# Patient Record
Sex: Female | Born: 1935 | Race: Black or African American | Hispanic: No | Marital: Single | Smoking: Never smoker
Health system: Southern US, Community
[De-identification: ages and names within clinical notes are randomized; demographics above are authoritative.]

## PROBLEM LIST (undated history)

## (undated) DIAGNOSIS — I10 Essential (primary) hypertension: Secondary | ICD-10-CM

## (undated) HISTORY — PX: ABDOMINAL HYSTERECTOMY: SHX81

---

## 1997-08-31 ENCOUNTER — Observation Stay (HOSPITAL_COMMUNITY): Admission: EM | Admit: 1997-08-31 | Discharge: 1997-09-01 | Payer: Self-pay | Admitting: Emergency Medicine

## 2000-09-20 ENCOUNTER — Emergency Department (HOSPITAL_COMMUNITY): Admission: EM | Admit: 2000-09-20 | Discharge: 2000-09-20 | Payer: Self-pay | Admitting: Emergency Medicine

## 2000-09-20 ENCOUNTER — Encounter: Payer: Self-pay | Admitting: Emergency Medicine

## 2000-12-31 ENCOUNTER — Other Ambulatory Visit: Admission: RE | Admit: 2000-12-31 | Discharge: 2000-12-31 | Payer: Self-pay | Admitting: Family Medicine

## 2001-02-04 ENCOUNTER — Emergency Department (HOSPITAL_COMMUNITY): Admission: EM | Admit: 2001-02-04 | Discharge: 2001-02-04 | Payer: Self-pay | Admitting: Emergency Medicine

## 2001-02-04 ENCOUNTER — Encounter: Payer: Self-pay | Admitting: Emergency Medicine

## 2003-04-11 ENCOUNTER — Emergency Department (HOSPITAL_COMMUNITY): Admission: EM | Admit: 2003-04-11 | Discharge: 2003-04-12 | Payer: Self-pay | Admitting: Emergency Medicine

## 2003-04-28 ENCOUNTER — Ambulatory Visit (HOSPITAL_COMMUNITY): Admission: RE | Admit: 2003-04-28 | Discharge: 2003-04-28 | Payer: Self-pay | Admitting: Family Medicine

## 2003-06-19 ENCOUNTER — Ambulatory Visit (HOSPITAL_COMMUNITY): Admission: RE | Admit: 2003-06-19 | Discharge: 2003-06-19 | Payer: Self-pay | Admitting: Family Medicine

## 2003-10-03 ENCOUNTER — Emergency Department (HOSPITAL_COMMUNITY): Admission: EM | Admit: 2003-10-03 | Discharge: 2003-10-03 | Payer: Self-pay | Admitting: Family Medicine

## 2005-03-15 ENCOUNTER — Emergency Department (HOSPITAL_COMMUNITY): Admission: EM | Admit: 2005-03-15 | Discharge: 2005-03-15 | Payer: Self-pay | Admitting: Emergency Medicine

## 2005-03-19 ENCOUNTER — Emergency Department (HOSPITAL_COMMUNITY): Admission: EM | Admit: 2005-03-19 | Discharge: 2005-03-19 | Payer: Self-pay | Admitting: Emergency Medicine

## 2006-11-27 ENCOUNTER — Encounter (INDEPENDENT_AMBULATORY_CARE_PROVIDER_SITE_OTHER): Payer: Self-pay | Admitting: Family Medicine

## 2007-12-17 ENCOUNTER — Encounter: Admission: RE | Admit: 2007-12-17 | Discharge: 2007-12-17 | Payer: Self-pay | Admitting: Family Medicine

## 2008-03-02 ENCOUNTER — Other Ambulatory Visit: Admission: RE | Admit: 2008-03-02 | Discharge: 2008-03-02 | Payer: Self-pay | Admitting: Family Medicine

## 2008-03-25 ENCOUNTER — Other Ambulatory Visit: Admission: RE | Admit: 2008-03-25 | Discharge: 2008-03-25 | Payer: Self-pay | Admitting: Family Medicine

## 2008-05-16 ENCOUNTER — Emergency Department (HOSPITAL_COMMUNITY): Admission: EM | Admit: 2008-05-16 | Discharge: 2008-05-16 | Payer: Self-pay | Admitting: Family Medicine

## 2008-06-09 ENCOUNTER — Encounter: Admission: RE | Admit: 2008-06-09 | Discharge: 2008-06-09 | Payer: Self-pay | Admitting: Family Medicine

## 2008-09-15 ENCOUNTER — Emergency Department (HOSPITAL_COMMUNITY): Admission: EM | Admit: 2008-09-15 | Discharge: 2008-09-15 | Payer: Self-pay | Admitting: Family Medicine

## 2008-09-17 ENCOUNTER — Encounter: Admission: RE | Admit: 2008-09-17 | Discharge: 2008-09-17 | Payer: Self-pay | Admitting: Family Medicine

## 2009-04-17 ENCOUNTER — Emergency Department (HOSPITAL_COMMUNITY): Admission: EM | Admit: 2009-04-17 | Discharge: 2009-04-17 | Payer: Self-pay | Admitting: Family Medicine

## 2009-04-27 ENCOUNTER — Emergency Department (HOSPITAL_COMMUNITY)
Admission: EM | Admit: 2009-04-27 | Discharge: 2009-04-27 | Payer: Self-pay | Source: Home / Self Care | Admitting: Family Medicine

## 2009-05-27 ENCOUNTER — Other Ambulatory Visit: Admission: RE | Admit: 2009-05-27 | Discharge: 2009-05-27 | Payer: Self-pay | Admitting: Family Medicine

## 2009-09-28 ENCOUNTER — Encounter: Admission: RE | Admit: 2009-09-28 | Discharge: 2009-09-28 | Payer: Self-pay | Admitting: Family Medicine

## 2009-09-29 ENCOUNTER — Encounter: Admission: RE | Admit: 2009-09-29 | Discharge: 2009-09-29 | Payer: Self-pay | Admitting: Family Medicine

## 2010-01-11 ENCOUNTER — Emergency Department (HOSPITAL_COMMUNITY): Admission: EM | Admit: 2010-01-11 | Discharge: 2010-01-11 | Payer: Self-pay | Admitting: Family Medicine

## 2010-06-05 LAB — POCT I-STAT, CHEM 8
BUN: 25 mg/dL — ABNORMAL HIGH (ref 6–23)
Calcium, Ion: 1.27 mmol/L (ref 1.12–1.32)
Creatinine, Ser: 1.1 mg/dL (ref 0.4–1.2)
TCO2: 29 mmol/L (ref 0–100)

## 2014-06-04 ENCOUNTER — Ambulatory Visit (HOSPITAL_COMMUNITY): Admission: RE | Admit: 2014-06-04 | Payer: Self-pay | Source: Ambulatory Visit | Admitting: Cardiology

## 2014-06-04 ENCOUNTER — Encounter (HOSPITAL_COMMUNITY): Admission: RE | Payer: Self-pay | Source: Ambulatory Visit

## 2014-06-04 SURGERY — CARDIOVERSION
Anesthesia: Monitor Anesthesia Care

## 2015-07-21 ENCOUNTER — Emergency Department (HOSPITAL_COMMUNITY)
Admission: EM | Admit: 2015-07-21 | Discharge: 2015-07-22 | Disposition: A | Discharge status: Home / Self Care | Payer: Medicare PPO | Attending: Emergency Medicine | Admitting: Emergency Medicine

## 2015-07-21 ENCOUNTER — Ambulatory Visit (HOSPITAL_COMMUNITY)
Admission: EM | Admit: 2015-07-21 | Discharge: 2015-07-21 | Disposition: A | Discharge status: Other Acute Inpatient Hospital | Payer: Medicare PPO | Source: Home / Self Care | Attending: Family Medicine | Admitting: Family Medicine

## 2015-07-21 ENCOUNTER — Encounter (HOSPITAL_COMMUNITY): Payer: Self-pay | Admitting: *Deleted

## 2015-07-21 ENCOUNTER — Encounter (HOSPITAL_COMMUNITY): Payer: Self-pay | Admitting: Emergency Medicine

## 2015-07-21 ENCOUNTER — Other Ambulatory Visit: Payer: Self-pay

## 2015-07-21 ENCOUNTER — Emergency Department (HOSPITAL_COMMUNITY): Payer: Medicare PPO

## 2015-07-21 DIAGNOSIS — Z79899 Other long term (current) drug therapy: Secondary | ICD-10-CM | POA: Insufficient documentation

## 2015-07-21 DIAGNOSIS — I482 Chronic atrial fibrillation, unspecified: Secondary | ICD-10-CM

## 2015-07-21 DIAGNOSIS — I10 Essential (primary) hypertension: Secondary | ICD-10-CM | POA: Insufficient documentation

## 2015-07-21 DIAGNOSIS — R42 Dizziness and giddiness: Secondary | ICD-10-CM | POA: Insufficient documentation

## 2015-07-21 DIAGNOSIS — R55 Syncope and collapse: Secondary | ICD-10-CM | POA: Diagnosis not present

## 2015-07-21 HISTORY — DX: Essential (primary) hypertension: I10

## 2015-07-21 LAB — POCT I-STAT, CHEM 8
BUN: 22 mg/dL — ABNORMAL HIGH (ref 6–20)
CHLORIDE: 103 mmol/L (ref 101–111)
Calcium, Ion: 1.26 mmol/L (ref 1.13–1.30)
Creatinine, Ser: 1 mg/dL (ref 0.44–1.00)
GLUCOSE: 87 mg/dL (ref 65–99)
HEMATOCRIT: 41 % (ref 36.0–46.0)
HEMOGLOBIN: 13.9 g/dL (ref 12.0–15.0)
POTASSIUM: 4.1 mmol/L (ref 3.5–5.1)
SODIUM: 142 mmol/L (ref 135–145)
TCO2: 28 mmol/L (ref 0–100)

## 2015-07-21 LAB — I-STAT CHEM 8, ED
BUN: 22 mg/dL — AB (ref 6–20)
CALCIUM ION: 1.23 mmol/L (ref 1.13–1.30)
CHLORIDE: 103 mmol/L (ref 101–111)
Creatinine, Ser: 1 mg/dL (ref 0.44–1.00)
Glucose, Bld: 88 mg/dL (ref 65–99)
HEMATOCRIT: 42 % (ref 36.0–46.0)
Hemoglobin: 14.3 g/dL (ref 12.0–15.0)
Potassium: 3.9 mmol/L (ref 3.5–5.1)
SODIUM: 143 mmol/L (ref 135–145)
TCO2: 26 mmol/L (ref 0–100)

## 2015-07-21 LAB — URINE MICROSCOPIC-ADD ON

## 2015-07-21 LAB — COMPREHENSIVE METABOLIC PANEL
ALT: 19 U/L (ref 14–54)
ANION GAP: 9 (ref 5–15)
AST: 34 U/L (ref 15–41)
Albumin: 3.5 g/dL (ref 3.5–5.0)
Alkaline Phosphatase: 121 U/L (ref 38–126)
BUN: 18 mg/dL (ref 6–20)
CALCIUM: 9.7 mg/dL (ref 8.9–10.3)
CHLORIDE: 107 mmol/L (ref 101–111)
CO2: 26 mmol/L (ref 22–32)
Creatinine, Ser: 1 mg/dL (ref 0.44–1.00)
GFR calc non Af Amer: 51 mL/min — ABNORMAL LOW (ref >60)
GFR, EST AFRICAN AMERICAN: 59 mL/min — AB (ref >60)
Glucose, Bld: 94 mg/dL (ref 65–99)
Potassium: 4.1 mmol/L (ref 3.5–5.1)
SODIUM: 142 mmol/L (ref 135–145)
TOTAL PROTEIN: 7.1 g/dL (ref 6.5–8.1)
Total Bilirubin: 0.4 mg/dL (ref 0.3–1.2)

## 2015-07-21 LAB — PROTIME-INR
INR: 1.39 (ref 0.00–1.49)
PROTHROMBIN TIME: 17.2 s — AB (ref 11.6–15.2)

## 2015-07-21 LAB — CBC
HCT: 38.9 % (ref 36.0–46.0)
Hemoglobin: 12.2 g/dL (ref 12.0–15.0)
MCH: 30.3 pg (ref 26.0–34.0)
MCHC: 31.4 g/dL (ref 30.0–36.0)
MCV: 96.5 fL (ref 78.0–100.0)
PLATELETS: 208 10*3/uL (ref 150–400)
RBC: 4.03 MIL/uL (ref 3.87–5.11)
RDW: 13.2 % (ref 11.5–15.5)
WBC: 3.9 10*3/uL — AB (ref 4.0–10.5)

## 2015-07-21 LAB — DIFFERENTIAL
BASOS PCT: 1 %
Basophils Absolute: 0 10*3/uL (ref 0.0–0.1)
EOS PCT: 3 %
Eosinophils Absolute: 0.1 10*3/uL (ref 0.0–0.7)
Lymphocytes Relative: 35 %
Lymphs Abs: 1.4 10*3/uL (ref 0.7–4.0)
MONO ABS: 0.4 10*3/uL (ref 0.1–1.0)
Monocytes Relative: 10 %
NEUTROS ABS: 2 10*3/uL (ref 1.7–7.7)
Neutrophils Relative %: 51 %

## 2015-07-21 LAB — URINALYSIS, ROUTINE W REFLEX MICROSCOPIC
Bilirubin Urine: NEGATIVE
GLUCOSE, UA: NEGATIVE mg/dL
Hgb urine dipstick: NEGATIVE
KETONES UR: NEGATIVE mg/dL
Nitrite: NEGATIVE
PROTEIN: NEGATIVE mg/dL
Specific Gravity, Urine: 1.01 (ref 1.005–1.030)
pH: 7 (ref 5.0–8.0)

## 2015-07-21 LAB — RAPID URINE DRUG SCREEN, HOSP PERFORMED
Amphetamines: NOT DETECTED
BARBITURATES: NOT DETECTED
Benzodiazepines: NOT DETECTED
Cocaine: NOT DETECTED
Opiates: NOT DETECTED
TETRAHYDROCANNABINOL: NOT DETECTED

## 2015-07-21 LAB — APTT: aPTT: 34 seconds (ref 24–37)

## 2015-07-21 LAB — I-STAT TROPONIN, ED: TROPONIN I, POC: 0.01 ng/mL (ref 0.00–0.08)

## 2015-07-21 NOTE — ED Notes (Signed)
Patient ambulatory to restroom with steady gait, denies dizziness.

## 2015-07-21 NOTE — ED Notes (Signed)
Pt  Reports     Symptoms       Of     Being dizzy              X sev   Days          With  Pain  Behind    Both  Knees   As   Well        Also  Reports pain  Behind both  Knees

## 2015-07-21 NOTE — ED Provider Notes (Signed)
CSN: [id number]     Arrival date & time 07/21/15  1451 History   First MD Initiated Contact with Patient 07/21/15 1649     Chief Complaint  Patient presents with  . Dizziness   (Consider location/radiation/quality/duration/timing/severity/associated sxs/prior Treatment) Patient is a 82 y.o. female presenting with dizziness. The history is provided by the patient.  Dizziness Description: no spinning just sudden imbalance feels like she will pass out, sl nausea, no palp or cp, no ha , no tinnitus. Severity:  Moderate Onset quality:  Sudden Progression:  Waxing and waning Chronicity:  Recurrent Context: not with inactivity, not with loss of consciousness and not when standing up   Relieved by:  Being still Worsened by:  Nothing Associated symptoms: nausea   Associated symptoms: no blood in stool, no chest pain, no diarrhea, no headaches, no hearing loss, no palpitations, no shortness of breath, no syncope, no tinnitus and no vomiting     Past Medical History  Diagnosis Date  . Hypertension    Past Surgical History  Procedure Laterality Date  . Abdominal hysterectomy     History reviewed. No pertinent family history. Social History  Substance Use Topics  . Smoking status: Never Smoker   . Smokeless tobacco: None  . Alcohol Use: No   OB History    No data available     Review of Systems  Constitutional: Negative.   HENT: Negative.  Negative for hearing loss and tinnitus.   Eyes: Negative.   Respiratory: Negative.  Negative for shortness of breath.   Cardiovascular: Negative.  Negative for chest pain, palpitations and syncope.  Gastrointestinal: Positive for nausea. Negative for vomiting, abdominal pain, diarrhea, constipation and blood in stool.  Musculoskeletal: Negative.   Skin: Negative.   Neurological: Positive for dizziness. Negative for headaches.    Allergies  Review of patient's allergies indicates no known allergies.  Home Medications   Prior to Admission  medications   Medication Sig Start Date End Date Taking? Authorizing Provider  LISINOPRIL PO Take by mouth.   Yes Historical Provider, MD  METOPROLOL TARTRATE PO Take by mouth.   Yes Historical Provider, MD  SIMVASTATIN PO Take by mouth.   Yes Historical Provider, MD   Meds Ordered and Administered this Visit  Medications - No data to display  BP 142/78 mmHg  Pulse 69  Temp(Src) 98.5 F (36.9 C) (Oral)  Resp 18  Ht  (1.702 m)  Wt 143 lb (64.864 kg)  BMI 22.39 kg/m2  SpO2 100% No data found.   Physical Exam  Constitutional: She is oriented to person, place, and time. She appears well-developed and well-nourished.  HENT:  Head: Normocephalic.  Right Ear: External ear normal.  Left Ear: External ear normal.  Mouth/Throat: Oropharynx is clear and moist.  Eyes: Conjunctivae and EOM are normal. Pupils are equal, round, and reactive to light.  Neck: Normal range of motion. Neck supple.  Cardiovascular: Normal rate, regular rhythm, normal heart sounds and intact distal pulses.   Pulmonary/Chest: Effort normal and breath sounds normal.  Abdominal: There is no tenderness.  Lymphadenopathy:    She has no cervical adenopathy.  Neurological: She is alert and oriented to person, place, and time.  Skin: Skin is warm and dry.  Nursing note and vitals reviewed.   ED Course  Procedures (including critical care time)  Labs Review Labs Reviewed  POCT I-STAT, CHEM 8 - Abnormal; Notable for the following:    BUN 22 (*)    All other components within  normal limits    Imaging Review No results found.   Visual Acuity Review  Right Eye Distance:   Left Eye Distance:   Bilateral Distance:    Right Eye Near:   Left Eye Near:    Bilateral Near:     [id number]-afib with cvr, no acute changes    MDM   1. Syncope, near    Sent for eval of prob cardiac-a fib related sudden episodic dizziness   Linna HoffJames D Gregori Abril, MD 07/21/15 1806

## 2015-07-21 NOTE — ED Notes (Signed)
Pt states "I fell from a chair two weeks ago and landed on my buttocks, it didn't seem to be sore until recently." Pt states she feels sore on her bottom. Pt also c/o dizzy spells. Pt denies having dizziness at this time. Pt states "I was dizzy today around lunch time". Pt in NAD, no neuro deficits. Pt states shes been having dizzy spells for three days. Pt denies pain at this time.

## 2015-07-21 NOTE — ED Notes (Signed)
Pt reports she has glaucoma and takes eye drops for her eyes and wonders if that has something to do with the dizziness.

## 2015-07-21 NOTE — ED Provider Notes (Signed)
CSN: [id number]     Arrival date & time 07/21/15  1829 History   First MD Initiated Contact with Patient 07/21/15 2102     Chief Complaint  Patient presents with  . Dizziness     (Consider location/radiation/quality/duration/timing/severity/associated sxs/prior Treatment) HPI Patient describes sudden episodes of spinning type of dizziness. She was the first time it happened, she thought the bed was moving around and grabbed it but then she realized that the sensation was coming from her. Patient reports that she fell out of a chair about 3 weeks ago. She reports she fell on her bottom and didn't think she had a significant symptoms in association with that. Approximately one week later she started getting a sporadic episodes of being very dizzy. They have been coming and going. They will come on suddenly without warning. Yesterday at lunch time, she was standing and an episode hit her suddenly. She called out for her companion to hold her and support her. She has not had syncopal episode. Chest pain or shortness of breath. She does report once or twice it felt like her right knee buckled and might give way she does not develop vomiting. No associated headache. Patient never had similar until 2 weeks ago.  Patient does have history of chronic atrial fibrillation. She is taking Xarelto and metoprolol. Past Medical History  Diagnosis Date  . Hypertension    Past Surgical History  Procedure Laterality Date  . Abdominal hysterectomy     No family history on file. Social History  Substance Use Topics  . Smoking status: Never Smoker   . Smokeless tobacco: None  . Alcohol Use: No   OB History    No data available     Review of Systems  10 Systems reviewed and are negative for acute change except as noted in the HPI.   Allergies  Latanoprost and Tomato  Home Medications   Prior to Admission medications   Medication Sig Start Date End Date Taking? Authorizing Provider   dexlansoprazole (DEXILANT) 60 MG capsule Take 60 mg by mouth daily.   Yes Historical Provider, MD  dorzolamide-timolol (COSOPT) 22.3-6.8 MG/ML ophthalmic solution Place 1 drop into both eyes 2 (two) times daily. 06/12/15  Yes Historical Provider, MD  latanoprost (XALATAN) 0.005 % ophthalmic solution Place 1 drop into both eyes at bedtime. 07/04/15  Yes Historical Provider, MD  lisinopril-hydrochlorothiazide (PRINZIDE,ZESTORETIC) 10-12.5 MG tablet Take 1 tablet by mouth daily. 05/29/15  Yes Historical Provider, MD  Menthol (HALLS COUGH DROPS MT) Use as directed 1 lozenge in the mouth or throat daily as needed (for cough).   Yes Historical Provider, MD  metoprolol succinate (TOPROL-XL) 25 MG 24 hr tablet Take 25 mg by mouth every morning. 06/15/15  Yes Historical Provider, MD  simvastatin (ZOCOR) 10 MG tablet Take 10 mg by mouth every evening. 05/29/15  Yes Historical Provider, MD  XARELTO 20 MG TABS tablet Take 20 mg by mouth every evening. 05/29/15  Yes Historical Provider, MD  meclizine (ANTIVERT) 25 MG tablet Take 1 tablet (25 mg total) by mouth 3 (three) times daily as needed for dizziness. 07/22/15   Arby BarretteMarcy Jammy Plotkin, MD   BP 171/82 mmHg  Pulse 64  Temp(Src) 97.9 F (36.6 C) (Oral)  Resp 18  SpO2 100% Physical Exam  Constitutional: She is oriented to person, place, and time. She appears well-developed and well-nourished.  HENT:  Head: Normocephalic and atraumatic.  Eyes: EOM are normal. Pupils are equal, round, and reactive to light.  Neck: Neck supple.  Cardiovascular: Normal rate, regular rhythm and intact distal pulses.   Irregularly irregular versus ectopy. No Gross rub murmur gallop.  Pulmonary/Chest: Effort normal and breath sounds normal.  Abdominal: Soft. Bowel sounds are normal. She exhibits no distension. There is no tenderness.  Musculoskeletal: Normal range of motion. She exhibits no edema or tenderness.  Neurological: She is alert and oriented to person, place, and time. She has  normal strength. No cranial nerve deficit. She exhibits normal muscle tone. Coordination normal. GCS eye subscore is 4. GCS verbal subscore is 5. GCS motor subscore is 6.  Normal finger-nose examination bilaterally. Normal heel shin examination bilaterally. Normal resistance against downward pressure for lower extremity elevation. No pronator drift. Cranial nerves II through XII intact.  Skin: Skin is warm, dry and intact.  Psychiatric: She has a normal mood and affect.    ED Course  Procedures (including critical care time) Labs Review Labs Reviewed  PROTIME-INR - Abnormal; Notable for the following:    Prothrombin Time 17.2 (*)    All other components within normal limits  CBC - Abnormal; Notable for the following:    WBC 3.9 (*)    All other components within normal limits  COMPREHENSIVE METABOLIC PANEL - Abnormal; Notable for the following:    GFR calc non Af Amer 51 (*)    GFR calc Af Amer 59 (*)    All other components within normal limits  URINALYSIS, ROUTINE W REFLEX MICROSCOPIC (NOT AT [facility]) - Abnormal; Notable for the following:    APPearance CLOUDY (*)    Leukocytes, UA LARGE (*)    All other components within normal limits  URINE MICROSCOPIC-ADD ON - Abnormal; Notable for the following:    Squamous Epithelial / LPF 6-30 (*)    Bacteria, UA FEW (*)    All other components within normal limits  I-STAT CHEM 8, ED - Abnormal; Notable for the following:    BUN 22 (*)    All other components within normal limits  APTT  DIFFERENTIAL  URINE RAPID DRUG SCREEN, HOSP PERFORMED  ETHANOL  I-STAT TROPOININ, ED  I-STAT TROPOININ, ED    Imaging Review No results found. I have personally reviewed and evaluated these images and lab results as part of my medical decision-making.  EKG: Interpreted by myself Atrial fibrillation rate of 58. No acute ST-T wave changes.  MDM   Final diagnoses:  Vertigo  Chronic atrial fibrillation (HCC)   Patient is having episodes of vertigo.  She does not have loss of consciousness. She does not have palpitations or chest pain. Patient does have a history of chronic atrial fibrillation and is compliant with her Xarelto and metoprolol. Her general appearance is well. At this time MRI is pending. Final disposition will be pending MRI results.    Arby Barrette, MD 07/22/15 0001

## 2015-07-21 NOTE — ED Notes (Signed)
Patient transported to MRI 

## 2015-07-22 MED ORDER — MECLIZINE HCL 25 MG PO TABS: 25.0000 mg | ORAL_TABLET | Freq: Three times a day (TID) | ORAL | Status: DC | PRN

## 2015-07-22 NOTE — ED Notes (Signed)
Patient verbalized understanding of discharge instructions and denies any further needs or questions at this time. VS stable. Patient ambulatory with steady gait.  

## 2015-07-22 NOTE — Discharge Instructions (Signed)
Benign Positional Vertigo °Vertigo is the feeling that you or your surroundings are moving when they are not. Benign positional vertigo is the most common form of vertigo. The cause of this condition is not serious (is benign). This condition is triggered by certain movements and positions (is positional). This condition can be dangerous if it occurs while you are doing something that could endanger you or others, such as driving.  °CAUSES °In many cases, the cause of this condition is not known. It may be caused by a disturbance in an area of the inner ear that helps your brain to sense movement and balance. This disturbance can be caused by a viral infection (labyrinthitis), head injury, or repetitive motion. °RISK FACTORS °This condition is more likely to develop in: °· Women. °· People who are 50 years of age or older. °SYMPTOMS °Symptoms of this condition usually happen when you move your head or your eyes in different directions. Symptoms may start suddenly, and they usually last for less than a minute. Symptoms may include: °· Loss of balance and falling. °· Feeling like you are spinning or moving. °· Feeling like your surroundings are spinning or moving. °· Nausea and vomiting. °· Blurred vision. °· Dizziness. °· Involuntary eye movement (nystagmus). °Symptoms can be mild and cause only slight annoyance, or they can be severe and interfere with daily life. Episodes of benign positional vertigo may return (recur) over time, and they may be triggered by certain movements. Symptoms may improve over time. °DIAGNOSIS °This condition is usually diagnosed by medical history and a physical exam of the head, neck, and ears. You may be referred to a health care provider who specializes in ear, nose, and throat (ENT) problems (otolaryngologist) or a provider who specializes in disorders of the nervous system (neurologist). You may have additional testing, including: °· MRI. °· A CT scan. °· Eye movement tests. Your  health care provider may ask you to change positions quickly while he or she watches you for symptoms of benign positional vertigo, such as nystagmus. Eye movement may be tested with an electronystagmogram (ENG), caloric stimulation, the Dix-Hallpike test, or the roll test. °· An electroencephalogram (EEG). This records electrical activity in your brain. °· Hearing tests. °TREATMENT °Usually, your health care provider will treat this by moving your head in specific positions to adjust your inner ear back to normal. Surgery may be needed in severe cases, but this is rare. In some cases, benign positional vertigo may resolve on its own in 2-4 weeks. °HOME CARE INSTRUCTIONS °Safety °· Move slowly. Avoid sudden body or head movements. °· Avoid driving. °· Avoid operating heavy machinery. °· Avoid doing any tasks that would be dangerous to you or others if a vertigo episode would occur. °· If you have trouble walking or keeping your balance, try using a cane for stability. If you feel dizzy or unstable, sit down right away. °· Return to your normal activities as told by your health care provider. Ask your health care provider what activities are safe for you. °General Instructions °· Take over-the-counter and prescription medicines only as told by your health care provider. °· Avoid certain positions or movements as told by your health care provider. °· Drink enough fluid to keep your urine clear or pale yellow. °· Keep all follow-up visits as told by your health care provider. This is important. °SEEK MEDICAL CARE IF: °· You have a fever. °· Your condition gets worse or you develop new symptoms. °· Your family or friends   notice any behavioral changes.  Your nausea or vomiting gets worse.  You have numbness or a "pins and needles" sensation. SEEK IMMEDIATE MEDICAL CARE IF:  You have difficulty speaking or moving.  You are always dizzy.  You faint.  You develop severe headaches.  You have weakness in your  legs or arms.  You have changes in your hearing or vision.  You develop a stiff neck.  You develop sensitivity to light.   This information is not intended to replace advice given to you by your health care provider. Make sure you discuss any questions you have with your health care provider.   Document Released: 12/12/2005 Document Revised: 11/25/2014 Document Reviewed: 06/29/2014 Elsevier Interactive Patient Education 2016 [employer]  Atrial Fibrillation Atrial fibrillation is a type of irregular or rapid heartbeat (arrhythmia). In atrial fibrillation, the heart quivers continuously in a chaotic pattern. This occurs when parts of the heart receive disorganized signals that make the heart unable to pump blood normally. This can increase the risk for stroke, heart failure, and other heart-related conditions. There are different types of atrial fibrillation, including:  Paroxysmal atrial fibrillation. This type starts suddenly, and it usually stops on its own shortly after it starts.  Persistent atrial fibrillation. This type often lasts longer than a week. It may stop on its own or with treatment.  Long-lasting persistent atrial fibrillation. This type lasts longer than 12 months.  Permanent atrial fibrillation. This type does not go away. Talk with your health care provider to learn about the type of atrial fibrillation that you have. CAUSES This condition is caused by some heart-related conditions or procedures, including:  A heart attack.  Coronary artery disease.  Heart failure.  Heart valve conditions.  High blood pressure.  Inflammation of the sac that surrounds the heart (pericarditis).  Heart surgery.  Certain heart rhythm disorders, such as Wolf-Parkinson-White syndrome. Other causes include:  Pneumonia.  Obstructive sleep apnea.  Blockage of an artery in the lungs (pulmonary embolism, or PE).  Lung cancer.  Chronic lung disease.  Thyroid problems,  especially if the thyroid is overactive (hyperthyroidism).  Caffeine.  Excessive alcohol use or illegal drug use.  Use of some medicines, including certain decongestants and diet pills. Sometimes, the cause cannot be found. RISK FACTORS This condition is more likely to develop in:  People who are older in age.  People who smoke.  People who have diabetes mellitus.  People who are overweight (obese).  Athletes who exercise vigorously. SYMPTOMS Symptoms of this condition include:  A feeling that your heart is beating rapidly or irregularly.  A feeling of discomfort or pain in your chest.  Shortness of breath.  Sudden light-headedness or weakness.  Getting tired easily during exercise. In some cases, there are no symptoms. DIAGNOSIS Your health care provider may be able to detect atrial fibrillation when taking your pulse. If detected, this condition may be diagnosed with:  An electrocardiogram (ECG).  A Holter monitor test that records your heartbeat patterns over a 24-hour period.  Transthoracic echocardiogram (TTE) to evaluate how blood flows through your heart.  Transesophageal echocardiogram (TEE) to view more detailed images of your heart.  A stress test.  Imaging tests, such as a CT scan or chest X-ray.  Blood tests. TREATMENT The main goals of treatment are to prevent blood clots from forming and to keep your heart beating at a normal rate and rhythm. The type of treatment that you receive depends on many factors, such as your underlying  medical conditions and how you feel when you are experiencing atrial fibrillation. This condition may be treated with:  Medicine to slow down the heart rate, bring the heart's rhythm back to normal, or prevent clots from forming.  Electrical cardioversion. This is a procedure that resets your heart's rhythm by delivering a controlled, low-energy shock to the heart through your skin.  Different types of ablation, such as  catheter ablation, catheter ablation with pacemaker, or surgical ablation. These procedures destroy the heart tissues that send abnormal signals. When the pacemaker is used, it is placed under your skin to help your heart beat in a regular rhythm. HOME CARE INSTRUCTIONS  Take over-the counter and prescription medicines only as told by your health care provider.  If your health care provider prescribed a blood-thinning medicine (anticoagulant), take it exactly as told. Taking too much blood-thinning medicine can cause bleeding. If you do not take enough blood-thinning medicine, you will not have the protection that you need against stroke and other problems.  Do not use tobacco products, including cigarettes, chewing tobacco, and e-cigarettes. If you need help quitting, ask your health care provider.  If you have obstructive sleep apnea, manage your condition as told by your health care provider.  Do not drink alcohol.  Do not drink beverages that contain caffeine, such as coffee, soda, and tea.  Maintain a healthy weight. Do not use diet pills unless your health care provider approves. Diet pills may make heart problems worse.  Follow diet instructions as told by your health care provider.  Exercise regularly as told by your health care provider.  Keep all follow-up visits as told by your health care provider. This is important. PREVENTION  Avoid drinking beverages that contain caffeine or alcohol.  Avoid certain medicines, especially medicines that are used for breathing problems.  Avoid certain herbs and herbal medicines, such as those that contain ephedra or ginseng.  Do not use illegal drugs, such as cocaine and amphetamines.  Do not smoke.  Manage your high blood pressure. SEEK MEDICAL CARE IF:  You notice a change in the rate, rhythm, or strength of your heartbeat.  You are taking an anticoagulant and you notice increased bruising.  You tire more easily when you  exercise or exert yourself. SEEK IMMEDIATE MEDICAL CARE IF:  You have chest pain, abdominal pain, sweating, or weakness.  You feel nauseous.  You notice blood in your vomit, bowel movement, or urine.  You have shortness of breath.  You suddenly have swollen feet and ankles.  You feel dizzy.  You have sudden weakness or numbness of the face, arm, or leg, especially on one side of the body.  You have trouble speaking, trouble understanding, or both (aphasia).  Your face or your eyelid droops on one side. These symptoms may represent a serious problem that is an emergency. Do not wait to see if the symptoms will go away. Get medical help right away. Call your local emergency services (911 in the [place] Do not drive yourself to the hospital.   This information is not intended to replace advice given to you by your health care provider. Make sure you discuss any questions you have with your health care provider.   Document Released: 03/06/2005 Document Revised: 11/25/2014 Document Reviewed: 07/01/2014 Elsevier Interactive Patient Education [employer]

## 2015-07-23 LAB — URINE CULTURE

## 2015-10-20 ENCOUNTER — Inpatient Hospital Stay (HOSPITAL_COMMUNITY): Admission: RE | Admit: 2015-10-20 | Payer: Medicare PPO | Source: Ambulatory Visit

## 2015-10-26 ENCOUNTER — Other Ambulatory Visit (HOSPITAL_COMMUNITY): Payer: Self-pay | Admitting: Internal Medicine

## 2015-10-26 ENCOUNTER — Ambulatory Visit (HOSPITAL_COMMUNITY)
Admission: RE | Admit: 2015-10-26 | Discharge: 2015-10-26 | Disposition: A | Discharge status: Home / Self Care | Payer: Medicare PPO | Source: Ambulatory Visit | Attending: Vascular Surgery | Admitting: Vascular Surgery

## 2015-10-26 DIAGNOSIS — I10 Essential (primary) hypertension: Secondary | ICD-10-CM | POA: Diagnosis not present

## 2015-10-26 DIAGNOSIS — M7989 Other specified soft tissue disorders: Secondary | ICD-10-CM | POA: Diagnosis not present

## 2016-08-23 ENCOUNTER — Ambulatory Visit (INDEPENDENT_AMBULATORY_CARE_PROVIDER_SITE_OTHER): Payer: Medicare PPO | Admitting: Podiatry

## 2016-08-23 NOTE — Progress Notes (Signed)
ERRONEOUS ENCOUNTER NO SHOW  

## 2019-10-04 ENCOUNTER — Emergency Department (HOSPITAL_COMMUNITY): Payer: Medicare PPO

## 2019-10-04 ENCOUNTER — Inpatient Hospital Stay (HOSPITAL_COMMUNITY)
Admission: EM | Admit: 2019-10-04 | Discharge: 2019-10-13 | DRG: 871 | Disposition: A | Discharge status: Hospice | Payer: Medicare PPO | Attending: Student | Admitting: Student

## 2019-10-04 ENCOUNTER — Encounter (HOSPITAL_COMMUNITY): Payer: Self-pay

## 2019-10-04 DIAGNOSIS — Z7901 Long term (current) use of anticoagulants: Secondary | ICD-10-CM

## 2019-10-04 DIAGNOSIS — K219 Gastro-esophageal reflux disease without esophagitis: Secondary | ICD-10-CM | POA: Diagnosis present

## 2019-10-04 DIAGNOSIS — R6521 Severe sepsis with septic shock: Secondary | ICD-10-CM | POA: Diagnosis present

## 2019-10-04 DIAGNOSIS — H4010X0 Unspecified open-angle glaucoma, stage unspecified: Secondary | ICD-10-CM | POA: Diagnosis present

## 2019-10-04 DIAGNOSIS — K5641 Fecal impaction: Secondary | ICD-10-CM | POA: Diagnosis present

## 2019-10-04 DIAGNOSIS — A09 Infectious gastroenteritis and colitis, unspecified: Secondary | ICD-10-CM

## 2019-10-04 DIAGNOSIS — E785 Hyperlipidemia, unspecified: Secondary | ICD-10-CM | POA: Diagnosis present

## 2019-10-04 DIAGNOSIS — E669 Obesity, unspecified: Secondary | ICD-10-CM | POA: Diagnosis present

## 2019-10-04 DIAGNOSIS — E8809 Other disorders of plasma-protein metabolism, not elsewhere classified: Secondary | ICD-10-CM | POA: Diagnosis present

## 2019-10-04 DIAGNOSIS — Z20822 Contact with and (suspected) exposure to covid-19: Secondary | ICD-10-CM | POA: Diagnosis present

## 2019-10-04 DIAGNOSIS — A4151 Sepsis due to Escherichia coli [E. coli]: Principal | ICD-10-CM | POA: Diagnosis present

## 2019-10-04 DIAGNOSIS — R627 Adult failure to thrive: Secondary | ICD-10-CM | POA: Diagnosis present

## 2019-10-04 DIAGNOSIS — E875 Hyperkalemia: Secondary | ICD-10-CM | POA: Diagnosis not present

## 2019-10-04 DIAGNOSIS — L89629 Pressure ulcer of left heel, unspecified stage: Secondary | ICD-10-CM | POA: Diagnosis present

## 2019-10-04 DIAGNOSIS — L89220 Pressure ulcer of left hip, unstageable: Secondary | ICD-10-CM | POA: Diagnosis present

## 2019-10-04 DIAGNOSIS — Z7189 Other specified counseling: Secondary | ICD-10-CM

## 2019-10-04 DIAGNOSIS — Z90710 Acquired absence of both cervix and uterus: Secondary | ICD-10-CM

## 2019-10-04 DIAGNOSIS — G9341 Metabolic encephalopathy: Secondary | ICD-10-CM | POA: Diagnosis present

## 2019-10-04 DIAGNOSIS — A419 Sepsis, unspecified organism: Secondary | ICD-10-CM | POA: Diagnosis present

## 2019-10-04 DIAGNOSIS — Z79899 Other long term (current) drug therapy: Secondary | ICD-10-CM

## 2019-10-04 DIAGNOSIS — I10 Essential (primary) hypertension: Secondary | ICD-10-CM | POA: Diagnosis present

## 2019-10-04 DIAGNOSIS — E43 Unspecified severe protein-calorie malnutrition: Secondary | ICD-10-CM

## 2019-10-04 DIAGNOSIS — R0600 Dyspnea, unspecified: Secondary | ICD-10-CM

## 2019-10-04 DIAGNOSIS — E872 Acidosis: Secondary | ICD-10-CM | POA: Diagnosis present

## 2019-10-04 DIAGNOSIS — Z6828 Body mass index (BMI) 28.0-28.9, adult: Secondary | ICD-10-CM

## 2019-10-04 DIAGNOSIS — E869 Volume depletion, unspecified: Secondary | ICD-10-CM | POA: Diagnosis present

## 2019-10-04 DIAGNOSIS — D6489 Other specified anemias: Secondary | ICD-10-CM | POA: Diagnosis present

## 2019-10-04 DIAGNOSIS — L89619 Pressure ulcer of right heel, unspecified stage: Secondary | ICD-10-CM | POA: Diagnosis present

## 2019-10-04 DIAGNOSIS — Z515 Encounter for palliative care: Secondary | ICD-10-CM

## 2019-10-04 DIAGNOSIS — I482 Chronic atrial fibrillation, unspecified: Secondary | ICD-10-CM | POA: Diagnosis present

## 2019-10-04 DIAGNOSIS — N179 Acute kidney failure, unspecified: Secondary | ICD-10-CM

## 2019-10-04 DIAGNOSIS — D638 Anemia in other chronic diseases classified elsewhere: Secondary | ICD-10-CM | POA: Diagnosis present

## 2019-10-04 DIAGNOSIS — Z7401 Bed confinement status: Secondary | ICD-10-CM

## 2019-10-04 DIAGNOSIS — L89210 Pressure ulcer of right hip, unstageable: Secondary | ICD-10-CM | POA: Diagnosis present

## 2019-10-04 DIAGNOSIS — L89150 Pressure ulcer of sacral region, unstageable: Secondary | ICD-10-CM | POA: Diagnosis present

## 2019-10-04 DIAGNOSIS — R5381 Other malaise: Secondary | ICD-10-CM | POA: Diagnosis present

## 2019-10-04 DIAGNOSIS — N3000 Acute cystitis without hematuria: Secondary | ICD-10-CM

## 2019-10-04 DIAGNOSIS — Z888 Allergy status to other drugs, medicaments and biological substances status: Secondary | ICD-10-CM

## 2019-10-04 DIAGNOSIS — Z66 Do not resuscitate: Secondary | ICD-10-CM

## 2019-10-04 DIAGNOSIS — E861 Hypovolemia: Secondary | ICD-10-CM | POA: Diagnosis present

## 2019-10-04 LAB — COMPREHENSIVE METABOLIC PANEL
ALT: 29 U/L (ref 0–44)
AST: 97 U/L — ABNORMAL HIGH (ref 15–41)
Albumin: 2.8 g/dL — ABNORMAL LOW (ref 3.5–5.0)
Alkaline Phosphatase: 63 U/L (ref 38–126)
Anion gap: 13 (ref 5–15)
BUN: 72 mg/dL — ABNORMAL HIGH (ref 8–23)
CO2: 21 mmol/L — ABNORMAL LOW (ref 22–32)
Calcium: 9 mg/dL (ref 8.9–10.3)
Chloride: 105 mmol/L (ref 98–111)
Creatinine, Ser: 2 mg/dL — ABNORMAL HIGH (ref 0.44–1.00)
GFR calc Af Amer: 26 mL/min — ABNORMAL LOW (ref >60)
GFR calc non Af Amer: 22 mL/min — ABNORMAL LOW (ref >60)
Glucose, Bld: 109 mg/dL — ABNORMAL HIGH (ref 70–99)
Potassium: 4.8 mmol/L (ref 3.5–5.1)
Sodium: 139 mmol/L (ref 135–145)
Total Bilirubin: 1.3 mg/dL — ABNORMAL HIGH (ref 0.3–1.2)
Total Protein: 6.9 g/dL (ref 6.5–8.1)

## 2019-10-04 LAB — CBC WITH DIFFERENTIAL/PLATELET
Abs Immature Granulocytes: 0.15 10*3/uL — ABNORMAL HIGH (ref 0.00–0.07)
Basophils Absolute: 0.1 10*3/uL (ref 0.0–0.1)
Basophils Relative: 0 %
Eosinophils Absolute: 0 10*3/uL (ref 0.0–0.5)
Eosinophils Relative: 0 %
HCT: 35.3 % — ABNORMAL LOW (ref 36.0–46.0)
Hemoglobin: 11.5 g/dL — ABNORMAL LOW (ref 12.0–15.0)
Immature Granulocytes: 1 %
Lymphocytes Relative: 5 %
Lymphs Abs: 0.9 10*3/uL (ref 0.7–4.0)
MCH: 31.9 pg (ref 26.0–34.0)
MCHC: 32.6 g/dL (ref 30.0–36.0)
MCV: 98.1 fL (ref 80.0–100.0)
Monocytes Absolute: 1.2 10*3/uL — ABNORMAL HIGH (ref 0.1–1.0)
Monocytes Relative: 7 %
Neutro Abs: 15.5 10*3/uL — ABNORMAL HIGH (ref 1.7–7.7)
Neutrophils Relative %: 87 %
Platelets: 280 10*3/uL (ref 150–400)
RBC: 3.6 MIL/uL — ABNORMAL LOW (ref 3.87–5.11)
RDW: 13.5 % (ref 11.5–15.5)
WBC: 17.8 10*3/uL — ABNORMAL HIGH (ref 4.0–10.5)
nRBC: 0 % (ref 0.0–0.2)

## 2019-10-04 LAB — I-STAT ARTERIAL BLOOD GAS, ED
Acid-base deficit: 5 mmol/L — ABNORMAL HIGH (ref 0.0–2.0)
Bicarbonate: 17.5 mmol/L — ABNORMAL LOW (ref 20.0–28.0)
Calcium, Ion: 1.14 mmol/L — ABNORMAL LOW (ref 1.15–1.40)
HCT: 25 % — ABNORMAL LOW (ref 36.0–46.0)
Hemoglobin: 8.5 g/dL — ABNORMAL LOW (ref 12.0–15.0)
O2 Saturation: 99 %
Patient temperature: 100
Potassium: 4 mmol/L (ref 3.5–5.1)
Sodium: 143 mmol/L (ref 135–145)
TCO2: 18 mmol/L — ABNORMAL LOW (ref 22–32)
pCO2 arterial: 25 mmHg — ABNORMAL LOW (ref 32.0–48.0)
pH, Arterial: 7.457 — ABNORMAL HIGH (ref 7.350–7.450)
pO2, Arterial: 114 mmHg — ABNORMAL HIGH (ref 83.0–108.0)

## 2019-10-04 LAB — URINALYSIS, ROUTINE W REFLEX MICROSCOPIC
Bilirubin Urine: NEGATIVE
Glucose, UA: NEGATIVE mg/dL
Ketones, ur: NEGATIVE mg/dL
Nitrite: POSITIVE — AB
Protein, ur: 30 mg/dL — AB
Specific Gravity, Urine: 1.016 (ref 1.005–1.030)
WBC, UA: >50 WBC/hpf — ABNORMAL HIGH (ref 0–5)
pH: 5 (ref 5.0–8.0)

## 2019-10-04 LAB — SARS CORONAVIRUS 2 BY RT PCR (HOSPITAL ORDER, PERFORMED IN ~~LOC~~ HOSPITAL LAB): SARS Coronavirus 2: NEGATIVE

## 2019-10-04 LAB — LACTIC ACID, PLASMA
Lactic Acid, Venous: 3.4 mmol/L (ref 0.5–1.9)
Lactic Acid, Venous: 3.4 mmol/L (ref 0.5–1.9)

## 2019-10-04 LAB — PROTIME-INR
INR: 1.7 — ABNORMAL HIGH (ref 0.8–1.2)
Prothrombin Time: 19.6 seconds — ABNORMAL HIGH (ref 11.4–15.2)

## 2019-10-04 LAB — CBG MONITORING, ED: Glucose-Capillary: 113 mg/dL — ABNORMAL HIGH (ref 70–99)

## 2019-10-04 LAB — APTT: aPTT: 33 seconds (ref 24–36)

## 2019-10-04 MED ORDER — SODIUM CHLORIDE 0.9% FLUSH
3.0000 mL | Freq: Once | INTRAVENOUS | Status: AC
  Administered 2019-10-04: 3 mL via INTRAVENOUS

## 2019-10-04 MED ORDER — VANCOMYCIN HCL 750 MG/150ML IV SOLN: 750.0000 mg | INTRAVENOUS | Status: DC

## 2019-10-04 MED ORDER — VANCOMYCIN HCL IN DEXTROSE 1-5 GM/200ML-% IV SOLN: 1000.0000 mg | Freq: Once | INTRAVENOUS | Status: DC

## 2019-10-04 MED ORDER — SODIUM CHLORIDE 0.9 % IV BOLUS (SEPSIS)
500.0000 mL | Freq: Once | INTRAVENOUS | Status: AC
  Administered 2019-10-04: 500 mL via INTRAVENOUS

## 2019-10-04 MED ORDER — MINERAL OIL RE ENEM
1.0000 | ENEMA | Freq: Once | RECTAL | Status: AC
  Administered 2019-10-05: 1 via RECTAL
  Filled 2019-10-04: qty 1

## 2019-10-04 MED ORDER — SODIUM CHLORIDE 0.9 % IV BOLUS (SEPSIS)
1000.0000 mL | Freq: Once | INTRAVENOUS | Status: AC
  Administered 2019-10-04: 1000 mL via INTRAVENOUS

## 2019-10-04 MED ORDER — VANCOMYCIN HCL 1500 MG/300ML IV SOLN
1500.0000 mg | Freq: Once | INTRAVENOUS | Status: AC
  Administered 2019-10-04: 1500 mg via INTRAVENOUS
  Filled 2019-10-04: qty 300

## 2019-10-04 MED ORDER — SODIUM CHLORIDE 0.9 % IV SOLN: Freq: Once | INTRAVENOUS | Status: AC

## 2019-10-04 MED ORDER — SODIUM CHLORIDE 0.9 % IV SOLN
2.0000 g | Freq: Once | INTRAVENOUS | Status: AC
  Administered 2019-10-04: 2 g via INTRAVENOUS
  Filled 2019-10-04: qty 2

## 2019-10-04 MED ORDER — SODIUM CHLORIDE 0.9 % IV SOLN
2.0000 g | INTRAVENOUS | Status: DC
  Administered 2019-10-05: 2 g via INTRAVENOUS
  Filled 2019-10-04: qty 2

## 2019-10-04 MED ORDER — METRONIDAZOLE IN NACL 5-0.79 MG/ML-% IV SOLN
500.0000 mg | Freq: Once | INTRAVENOUS | Status: AC
  Administered 2019-10-04: 500 mg via INTRAVENOUS
  Filled 2019-10-04: qty 100

## 2019-10-04 NOTE — H&P (Signed)
NAME:  Heather Singleton, MRN:  [id number], DOB:  [birth year], LOS: 0 ADMISSION DATE:  10/04/2019, CONSULTATION DATE:  7/17 REFERRING MD:  EDP, CHIEF COMPLAINT:  sepsis   Brief History   86yo female with hx HTN presented 7/17 with weakness and diarrhea.  She is cared for at home by her husband who is poor historian. She has significant sacral decub ulcers.  Reportedly wasn't feeling well x1 week, called PCP who gave abx for UTI but she continued to deteriorate at home. On arrival to ER was hypotensive with SBP 70's, afib with RVR 150's, u/a dirty, lactate 3.4, Scr 2, WBC 17.8, somnolent mental status.  BP, HR and mental status improved somewhat with volume but BP remains borderline so PCCM asked to admit to ICU.   History of present illness   86yo female with hx HTN presented 7/17 with weakness and diarrhea.  She is cared for at home by her husband who is poor historian. She has significant sacral decub ulcers.  Reportedly wasn't feeling well x1 week, called PCP who gave abx for UTI but she continued to deteriorate at home. On arrival to ER was hypotensive with SBP 70's, afib with RVR 150's, u/a dirty, lactate 3.4, Scr 2, WBC 17.8, somnolent mental status.  BP, HR and mental status improved somewhat with volume but BP remains borderline so PCCM asked to admit to ICU.   Past Medical History   has a past medical history of Hypertension.   Significant Hospital Events     Consults:    Procedures:    Significant Diagnostic Tests:  CT abd/pelvis 7/17>>> 1. The rectosigmoid contains an inspissated stool ball measuring up to 6.2 cm in diameter with some circumferential mural thickening and perirectal/mesorectal fat stranding and presacral thickening. Findings are concerning for fecal impaction and stercoral colitis. 2. Few clustered loops of small bowel in the left upper abdomen with some surrounding hazy mesenteric stranding versus motion artifact. This finding is nonspecific but may reflect an  enteritis. 3. Small ventral diastasis/broad-based umbilical hernia with Richter's type protrusion of the anti mesenteric wall of the transverse colon without evidence of resulting obstruction or vascular compromise. 4. Aortic Atherosclerosis (ICD10-I70.0). CT head 7/17>>> neg acute   Micro Data:  Urine 7/17>>>  BC x2 7/17>>>  Antimicrobials:  Cefepime 7/17>>> vanc 7/17>>>  Interim history/subjective:  Seen in ER.  Improved mental status per RN.  HR 110, SBP 105.   Objective   Blood pressure 101/67, pulse 70, temperature 99.1 F (37.3 C), resp. rate 15, height 5\' 5"  (1.651 m), weight 78.2 kg, SpO2 94 %.        Intake/Output Summary (Last 24 hours) at 10/05/2019 0045 Last data filed at 10/05/2019 0035 Gross per 24 hour  Intake 4000 ml  Output 250 ml  Net 3750 ml   Filed Weights   10/04/19 1949  Weight: 78.2 kg    Examination: General: frail, chronically ill appearing, disheveled female, NAD  HENT: mm dry, no JVD  Lungs: resps even non labored, clear  Cardiovascular: s1s2 irreg 110 Abdomen: soft, mildly tender, +bs  Extremities: warm and dry, multiple areas of poorly healing ulcers BLE, large sacral decub Neuro: awake, opens eyes, follows commands intermittently, protecting airway, not verbalizing    Resolved Hospital Problem list     Assessment & Plan:  Severe sepsis - r/t UTI +/- decubitus ulcers  PLAN -  Volume resuscitation  Broad spectrum abx  Trend lactate, pct  Pan culture  Low dose peripheral neo if  pressors needed   Afib with RVR -- ?chronic Afib given xarelto on home med list. Improved with volume  PLAN -  Monitor  Gentle volume  Resume home toprol in am if no pressor requirements  Resume xarelto in am if hemodynamically stable and no CVL needed   Hx HTN  PLAN -  Holding home anti-HTN   Diarrhea - Fecal impaction noted on CT  PLAN -  Enema given in ER  May need digital fecal disimpaction  Hold lomotil   Sacral decubitus  PLAN -  Wound  care RN to see  SW consult  Palliative care consult  Suspect husband cannot adequately care for her at home    Best practice:  Diet: NPO Pain/Anxiety/Delirium protocol (if indicated): n/a VAP protocol (if indicated): n/a DVT prophylaxis: Resume xarelto in am  GI prophylaxis: n/a Glucose control: n/a Mobility: BR Code Status: full Family Communication: husband updated by EDP  Disposition: ICU  Labs   CBC: Recent Labs  Lab 10/04/19 1952 10/04/19 2133  WBC 17.8*  --   NEUTROABS 15.5*  --   HGB 11.5* 8.5*  HCT 35.3* 25.0*  MCV 98.1  --   PLT 280  --     Basic Metabolic Panel: Recent Labs  Lab 10/04/19 1952 10/04/19 2133  NA 139 143  K 4.8 4.0  CL 105  --   CO2 21*  --   GLUCOSE 109*  --   BUN 72*  --   CREATININE 2.00*  --   CALCIUM 9.0  --    GFR: Estimated Creatinine Clearance: 20.9 mL/min (A) (by C-G formula based on SCr of 2 mg/dL (H)). Recent Labs  Lab 10/04/19 1952 10/04/19 2022 10/04/19 2239  WBC 17.8*  --   --   LATICACIDVEN  --  3.4* 3.4*    Liver Function Tests: Recent Labs  Lab 10/04/19 1952  AST 97*  ALT 29  ALKPHOS 63  BILITOT 1.3*  PROT 6.9  ALBUMIN 2.8*   No results for input(s): LIPASE, AMYLASE in the last 168 hours. No results for input(s): AMMONIA in the last 168 hours.  ABG    Component Value Date/Time   PHART 7.457 (H) 10/04/2019 2133   PCO2ART 25.0 (L) 10/04/2019 2133   PO2ART 114 (H) 10/04/2019 2133   HCO3 17.5 (L) 10/04/2019 2133   TCO2 18 (L) 10/04/2019 2133   ACIDBASEDEF 5.0 (H) 10/04/2019 2133   O2SAT 99.0 10/04/2019 2133     Coagulation Profile: Recent Labs  Lab 10/04/19 1952  INR 1.7*    Cardiac Enzymes: No results for input(s): CKTOTAL, CKMB, CKMBINDEX, TROPONINI in the last 168 hours.  HbA1C: No results found for: HGBA1C  CBG: Recent Labs  Lab 10/04/19 1913  GLUCAP 113*    Review of Systems:   As per HPI - All other systems reviewed and were neg.     Past Medical History  She,  has a  past medical history of Hypertension.   Surgical History    Past Surgical History:  Procedure Laterality Date  . ABDOMINAL HYSTERECTOMY       Social History   reports that she has never smoked. She does not have any smokeless tobacco history on file. She reports that she does not drink alcohol.   Family History   Her family history is not on file.   Allergies Allergies  Allergen Reactions  . Latanoprost Shortness Of Breath    Had asthma as a child, and her MD told her that the  medication could be causing her problems  . Tomato Other (See Comments)    Acid reflux issues     Home Medications  Prior to Admission medications   Medication Sig Start Date End Date Taking? Authorizing Provider  brimonidine (ALPHAGAN) 0.2 % ophthalmic solution Place 1 drop into both eyes 3 (three) times daily.  09/24/19   [provider]  dexlansoprazole (DEXILANT) 60 MG capsule Take 60 mg by mouth daily.    [provider]  diphenoxylate-atropine (LOMOTIL) 2.5-0.025 MG tablet Take 1 tablet by mouth 2 (two) times daily as needed for diarrhea or loose stools.  10/02/19   [provider]  dorzolamide-timolol (COSOPT) 22.3-6.8 MG/ML ophthalmic solution Place 1 drop into both eyes 2 (two) times daily. 06/12/15   [provider]  furosemide (LASIX) 20 MG tablet Take 20 mg by mouth daily as needed for fluid or edema.  09/03/19   [provider]  latanoprost (XALATAN) 0.005 % ophthalmic solution Place 1 drop into both eyes at bedtime. 07/04/15   [provider]  lisinopril-hydrochlorothiazide (PRINZIDE,ZESTORETIC) 10-12.5 MG tablet Take 1 tablet by mouth daily. 05/29/15   [provider]  meclizine (ANTIVERT) 25 MG tablet Take 1 tablet (25 mg total) by mouth 3 (three) times daily as needed for dizziness. 07/22/15   Arby Barrette, MD  Menthol (HALLS COUGH DROPS MT) Use as directed 1 lozenge in the mouth or throat daily as needed (for cough).    [provider]  metoprolol succinate (TOPROL-XL) 25 MG 24 hr tablet Take 25 mg by mouth every morning. 06/15/15   [provider]  nitrofurantoin, macrocrystal-monohydrate, (MACROBID) 100 MG capsule Take 100 mg by mouth 2 (two) times daily.  09/25/19   [provider]  simvastatin (ZOCOR) 10 MG tablet Take 10 mg by mouth every evening. 05/29/15   [provider]  XARELTO 20 MG TABS tablet Take 20 mg by mouth every evening. 05/29/15   [provider]     Critical care time:    Dirk Dress, NP Pulmonary/Critical Care Medicine  10/05/2019  12:45 AM

## 2019-10-04 NOTE — Progress Notes (Signed)
Pharmacy Antibiotic Note  Heather Singleton is a 86 y.o. female admitted on 10/04/2019 with sepsis.  Pharmacy has been consulted for Cefepime and Vancomycin dosing.   Height: 5\' 5"  (165.1 cm) Weight: 78.2 kg (172 lb 6.4 oz) IBW/kg (Calculated) : 57  Temp (24hrs), Avg:100 F (37.8 C), Min:98.6 F (37 C), Max:100.9 F (38.3 C)  Recent Labs  Lab 10/04/19 1952 10/04/19 2022  WBC 17.8*  --   CREATININE 2.00*  --   LATICACIDVEN  --  3.4*    Estimated Creatinine Clearance: 20.9 mL/min (A) (by C-G formula based on SCr of 2 mg/dL (H)).    Allergies  Allergen Reactions  . Latanoprost Shortness Of Breath    Had asthma as a child, and her MD told her that the medication could be causing her problems  . Tomato Other (See Comments)    Acid reflux issues    Antimicrobials this admission: 7/17 Cefepime >>  7/17 Vancomycin >>   Dose adjustments this admission: N/a  Microbiology results: Pending   Plan:  - Cefepime 2g IV q24h - Vancomycin 1500mg  IV x 1 dose  - Followed by Vancomycin 750mg  IV q24h ( Nomogram dosing)  - Monitor patients renal function and urine output  - De-escalate ABX when appropriate   Thank you for allowing pharmacy to be a part of this patient's care.  8/17 PharmD. BCPS 10/04/2019 9:34 PM

## 2019-10-04 NOTE — ED Notes (Signed)
Pt husband Amparo Donalson [phone]

## 2019-10-04 NOTE — ED Notes (Signed)
Multiple attempts at pulse oximetry; unsuccessful on forehead, finger, ear

## 2019-10-04 NOTE — ED Notes (Signed)
Pt taken to CT.

## 2019-10-04 NOTE — ED Triage Notes (Signed)
Pt comes from home via [facility], c/o of diarrhea for the past 4-5 days and bilateral leg pain with generalized weakness.

## 2019-10-04 NOTE — ED Provider Notes (Signed)
[facility]   CSN: [id number] Arrival date & time: 10/04/19  1903     History Chief Complaint  Patient presents with  . Diarrhea  . Weakness    Heather Singleton is a 86 y.o. female.  HPI Level 5 caveat secondary to patient's acuity of condition and altered mental status    86 year old female brought in via EMS with reports of diarrhea and weakness for the past 4 to 5 days.  EMS reports that her husband went to the nearby fire station to call for help.  Patient is unable to give me any further history at this time other than that she has been having diarrhea.  Past Medical History:  Diagnosis Date  . Hypertension     There are no problems to display for this patient.   Past Surgical History:  Procedure Laterality Date  . ABDOMINAL HYSTERECTOMY       OB History   No obstetric history on file.     No family history on file.  Social History   Tobacco Use  . Smoking status: Never Smoker  Substance Use Topics  . Alcohol use: No  . Drug use: Not on file    Home Medications Prior to Admission medications   Medication Sig Start Date End Date Taking? Authorizing Provider  dexlansoprazole (DEXILANT) 60 MG capsule Take 60 mg by mouth daily.    [provider]  dorzolamide-timolol (COSOPT) 22.3-6.8 MG/ML ophthalmic solution Place 1 drop into both eyes 2 (two) times daily. 06/12/15   [provider]  latanoprost (XALATAN) 0.005 % ophthalmic solution Place 1 drop into both eyes at bedtime. 07/04/15   [provider]  lisinopril-hydrochlorothiazide (PRINZIDE,ZESTORETIC) 10-12.5 MG tablet Take 1 tablet by mouth daily. 05/29/15   [provider]  meclizine (ANTIVERT) 25 MG tablet Take 1 tablet (25 mg total) by mouth 3 (three) times daily as needed for dizziness. 07/22/15   Arby BarrettePfeiffer, Marcy, MD  Menthol (HALLS COUGH DROPS MT) Use as directed 1 lozenge in the mouth or throat daily as needed (for  cough).    [provider]  metoprolol succinate (TOPROL-XL) 25 MG 24 hr tablet Take 25 mg by mouth every morning. 06/15/15   [provider]  simvastatin (ZOCOR) 10 MG tablet Take 10 mg by mouth every evening. 05/29/15   [provider]  XARELTO 20 MG TABS tablet Take 20 mg by mouth every evening. 05/29/15   [provider]   Lisinopril, Metoprolol, Nitrofurantoin, Other - lomotil, Simvastatin, Xarelto Allergies    Latanoprost and Tomato  Review of Systems   Review of Systems  Unable to perform ROS: Acuity of condition    Physical Exam Updated Vital Signs BP 133/62   Pulse (!) 157   Temp (!) 100.9 F (38.3 C) (Rectal)   Resp 20   Ht 1.651 m (5\' 5" )   Wt 78.2 kg   SpO2 98%   BMI 28.69 kg/m   Physical Exam Vitals and nursing note reviewed.  Constitutional:      General: She is not in acute distress.    Appearance: Normal appearance. She is obese. She is ill-appearing.  HENT:     Head: Normocephalic.     Right Ear: External ear normal.     Left Ear: External ear normal.     Nose: Nose normal.     Mouth/Throat:     Mouth: Mucous membranes are dry.  Eyes:     Pupils: Pupils are  equal, round, and reactive to light.  Cardiovascular:     Rate and Rhythm: Tachycardia present. Rhythm irregular.     Pulses: Normal pulses.  Pulmonary:     Effort: Pulmonary effort is normal.     Breath sounds: Normal breath sounds.  Abdominal:     Palpations: Abdomen is soft.     Tenderness: There is abdominal tenderness.  Genitourinary:    Comments: Diffuse perirectal diffuse perirectal skin breakdown with erythema and stool Musculoskeletal:        General: Normal range of motion.     Cervical back: Normal range of motion.  Skin:    General: Skin is warm and dry.  Neurological:     General: No focal deficit present.     Mental Status: She is alert.     Cranial Nerves: No cranial nerve deficit.     Motor: Weakness present.     Comments: Diffuse  general diffuse generalized weakness     ED Results / Procedures / Treatments   Labs (all labs ordered are listed, but only abnormal results are displayed) Labs Reviewed  COMPREHENSIVE METABOLIC PANEL - Abnormal; Notable for the following components:      Result Value   CO2 21 (*)    Glucose, Bld 109 (*)    BUN 72 (*)    Creatinine, Ser 2.00 (*)    Albumin 2.8 (*)    AST 97 (*)    Total Bilirubin 1.3 (*)    GFR calc non Af Amer 22 (*)    GFR calc Af Amer 26 (*)    All other components within normal limits  CBC WITH DIFFERENTIAL/PLATELET - Abnormal; Notable for the following components:   WBC 17.8 (*)    RBC 3.60 (*)    Hemoglobin 11.5 (*)    HCT 35.3 (*)    Neutro Abs 15.5 (*)    Monocytes Absolute 1.2 (*)    Abs Immature Granulocytes 0.15 (*)    All other components within normal limits  PROTIME-INR - Abnormal; Notable for the following components:   Prothrombin Time 19.6 (*)    INR 1.7 (*)    All other components within normal limits  URINALYSIS, ROUTINE W REFLEX MICROSCOPIC - Abnormal; Notable for the following components:   Color, Urine AMBER (*)    APPearance CLOUDY (*)    Hgb urine dipstick MODERATE (*)    Protein, ur 30 (*)    Nitrite POSITIVE (*)    Leukocytes,Ua LARGE (*)    WBC, UA >50 (*)    Bacteria, UA MANY (*)    All other components within normal limits  CBG MONITORING, ED - Abnormal; Notable for the following components:   Glucose-Capillary 113 (*)    All other components within normal limits  CULTURE, BLOOD (ROUTINE X 2)  CULTURE, BLOOD (ROUTINE X 2)  URINE CULTURE  SARS CORONAVIRUS 2 BY RT PCR (HOSPITAL ORDER, PERFORMED IN [facility])  GASTROINTESTINAL PANEL BY PCR, STOOL (REPLACES STOOL CULTURE)  C DIFFICILE QUICK SCREEN W PCR REFLEX  APTT  LACTIC ACID, PLASMA  LACTIC ACID, PLASMA    EKG EKG Interpretation  Date/Time:  Saturday October 04 2019 20:16:51 EDT Ventricular Rate:  143 PR Interval:    QRS Duration: 51 QT  Interval:  320 QTC Calculation: 494 R Axis:   45 Text Interpretation: Atrial fibrillation with rapid V-rate Ventricular premature complex Low voltage, extremity and precordial leads Repolarization abnormality, prob rate related Confirmed by Margarita Grizzle [phone]) on 10/04/2019 9:29:03  PM   Radiology DG Chest Port 1 View  Result Date: 10/04/2019 CLINICAL DATA:  Weakness. EXAM: PORTABLE CHEST 1 VIEW COMPARISON:  September 28, 2009 FINDINGS: The lungs are hyperinflated. Mild, diffuse chronic appearing increased lung markings are seen. There is no evidence of acute infiltrate, pleural effusion or pneumothorax. The cardiac silhouette is moderately enlarged. There is mild calcification of the aortic arch. The visualized skeletal structures are unremarkable. IMPRESSION: No active disease. Electronically Signed   By: Aram Candela M.D.   On: 10/04/2019 20:49    Procedures .Critical Care Performed by: Margarita Grizzle, MD Authorized by: Margarita Grizzle, MD   Critical care provider statement:    Critical care time (minutes):  45   Critical care end time:  10/04/2019 11:53 PM   Critical care was necessary to treat or prevent imminent or life-threatening deterioration of the following conditions:  Sepsis   Critical care was time spent personally by me on the following activities:  Discussions with consultants, evaluation of patient's response to treatment, examination of patient, ordering and performing treatments and interventions, ordering and review of laboratory studies, ordering and review of radiographic studies, pulse oximetry, re-evaluation of patient's condition, obtaining history from patient or surrogate and review of old charts   (including critical care time)  Medications Ordered in ED Medications  ceFEPIme (MAXIPIME) 2 g in sodium chloride 0.9 % 100 mL IVPB (2 g Intravenous New Bag/Given 10/04/19 2027)  metroNIDAZOLE (FLAGYL) IVPB 500 mg (500 mg Intravenous New Bag/Given 10/04/19 2028)    vancomycin (VANCOREADY) IVPB 1500 mg/300 mL (1,500 mg Intravenous New Bag/Given 10/04/19 2044)  sodium chloride flush (NS) 0.9 % injection 3 mL (3 mLs Intravenous Given 10/04/19 1957)  sodium chloride 0.9 % bolus 1,000 mL (1,000 mLs Intravenous New Bag/Given 10/04/19 2005)    ED Course  I have reviewed the triage vital signs and the nursing notes.  Pertinent labs & imaging results that were available during my care of the patient were reviewed by me and considered in my medical decision making (see chart for details).     MDM Rules/Calculators/A&P                          9:29 PM Husband now at bedside. Per husband's history, it sounds like patient has been sick for approximately 1 week.  She was seen by her primary care and placed on medications for infection and diarrhea.  Review of medications that EMS recorded indicate that the patient is on nitrofurantoin and Lomotil which would be consistent with this history.  However, the husband's time framing of complaints changes throughout the history and it is unclear if he is able to give me accurate history.  He reports that the patient has been sicker over the past 2 to 3 days and has had lower extremity weakness and unable to to walk.  He then states that she has been awake and talking him today and ate a normal food intake up until this occurred today. Patient continues critically ill with fever, hypotension, and tachycardia.  Patient is receiving normal saline bolus at 30 cc/kg.  The first liters in.  Her last blood pressure while I was standing at the bedtime side was 92/67.  However it has fluctuated between 78 into the 90s.  She is in rapid A. fib with a heart rate of approximately 150.  I not have any old records on her but it is possible given that she is on Xarelto  that she has had chronic A. Fib. Currently have been analyzing risk-benefit of cardioversion versus Cardizem.  Given that the patient appears significantly volume depleted and  septic, will continue with IV fluids as long as she maintains systolic blood pressure in the 90s.  If she deteriorates, will cardiovert emergently. Patient is somewhat somnolent.  An ABG is currently being obtained.  She does not appear to be in respiratory distress.  We have had difficulty obtaining accurate saturations due to correlation of heart rate with monitor.  She is on several liters and is slightly tachypneic at about 20. Initial urine output was clear but has become more purulent with Foley catheter in place. 11:47 PM HR decreased to 110s, bp 107/60, lungs cta Sepsis - Repeat Assessment  Performed at:    11:48 PM   Vitals     Blood pressure (!) 91/58, pulse (!) 31, temperature 99.4 F (37.4 C), resp. rate 15, height 1.651 m (5\' 5" ), weight 78.2 kg, SpO2 98 %.  Heart:     Irregular rate and rhythm  Lungs:    CTA  Capillary Refill:   > 2 sec  Peripheral Pulse:   Radial pulse palpable  Skin:     Pale  Discussed with elink and critical care to see Dr. at bedside and aware of patient's critical status  1-septic shock- uti vs cellulitis of perirectal area 2-aki 3- afib rvr 4- fecal impaction noted on ct with ?stercoral colitis 5- diarrhea- stool studies sent  Final Clinical Impression(s) / ED Diagnoses Final diagnoses:  Septic shock [facility])    Rx / DC Orders ED Discharge Orders    None       [facility], MD 10/04/19 2353

## 2019-10-05 ENCOUNTER — Other Ambulatory Visit: Payer: Self-pay

## 2019-10-05 ENCOUNTER — Encounter (HOSPITAL_COMMUNITY): Payer: Self-pay | Admitting: Internal Medicine

## 2019-10-05 DIAGNOSIS — H4010X0 Unspecified open-angle glaucoma, stage unspecified: Secondary | ICD-10-CM | POA: Diagnosis present

## 2019-10-05 DIAGNOSIS — Z6828 Body mass index (BMI) 28.0-28.9, adult: Secondary | ICD-10-CM | POA: Diagnosis not present

## 2019-10-05 DIAGNOSIS — L89629 Pressure ulcer of left heel, unspecified stage: Secondary | ICD-10-CM | POA: Diagnosis present

## 2019-10-05 DIAGNOSIS — A419 Sepsis, unspecified organism: Secondary | ICD-10-CM | POA: Diagnosis present

## 2019-10-05 DIAGNOSIS — G9341 Metabolic encephalopathy: Secondary | ICD-10-CM | POA: Diagnosis present

## 2019-10-05 DIAGNOSIS — Z66 Do not resuscitate: Secondary | ICD-10-CM

## 2019-10-05 DIAGNOSIS — R6521 Severe sepsis with septic shock: Secondary | ICD-10-CM | POA: Diagnosis present

## 2019-10-05 DIAGNOSIS — E785 Hyperlipidemia, unspecified: Secondary | ICD-10-CM | POA: Diagnosis present

## 2019-10-05 DIAGNOSIS — E872 Acidosis: Secondary | ICD-10-CM | POA: Diagnosis present

## 2019-10-05 DIAGNOSIS — Z515 Encounter for palliative care: Secondary | ICD-10-CM

## 2019-10-05 DIAGNOSIS — L89220 Pressure ulcer of left hip, unstageable: Secondary | ICD-10-CM | POA: Diagnosis present

## 2019-10-05 DIAGNOSIS — G9340 Encephalopathy, unspecified: Secondary | ICD-10-CM | POA: Diagnosis not present

## 2019-10-05 DIAGNOSIS — Z20822 Contact with and (suspected) exposure to covid-19: Secondary | ICD-10-CM | POA: Diagnosis present

## 2019-10-05 DIAGNOSIS — N179 Acute kidney failure, unspecified: Secondary | ICD-10-CM | POA: Diagnosis present

## 2019-10-05 DIAGNOSIS — L89210 Pressure ulcer of right hip, unstageable: Secondary | ICD-10-CM | POA: Diagnosis present

## 2019-10-05 DIAGNOSIS — N390 Urinary tract infection, site not specified: Secondary | ICD-10-CM | POA: Diagnosis not present

## 2019-10-05 DIAGNOSIS — Z7189 Other specified counseling: Secondary | ICD-10-CM

## 2019-10-05 DIAGNOSIS — A4151 Sepsis due to Escherichia coli [E. coli]: Secondary | ICD-10-CM | POA: Diagnosis present

## 2019-10-05 DIAGNOSIS — E43 Unspecified severe protein-calorie malnutrition: Secondary | ICD-10-CM | POA: Diagnosis present

## 2019-10-05 DIAGNOSIS — E861 Hypovolemia: Secondary | ICD-10-CM | POA: Diagnosis present

## 2019-10-05 DIAGNOSIS — E669 Obesity, unspecified: Secondary | ICD-10-CM | POA: Diagnosis present

## 2019-10-05 DIAGNOSIS — D6489 Other specified anemias: Secondary | ICD-10-CM | POA: Diagnosis present

## 2019-10-05 DIAGNOSIS — K219 Gastro-esophageal reflux disease without esophagitis: Secondary | ICD-10-CM | POA: Diagnosis present

## 2019-10-05 DIAGNOSIS — I482 Chronic atrial fibrillation, unspecified: Secondary | ICD-10-CM | POA: Diagnosis present

## 2019-10-05 DIAGNOSIS — N3000 Acute cystitis without hematuria: Secondary | ICD-10-CM | POA: Diagnosis present

## 2019-10-05 DIAGNOSIS — I10 Essential (primary) hypertension: Secondary | ICD-10-CM | POA: Diagnosis present

## 2019-10-05 DIAGNOSIS — L89150 Pressure ulcer of sacral region, unstageable: Secondary | ICD-10-CM | POA: Diagnosis present

## 2019-10-05 DIAGNOSIS — K5641 Fecal impaction: Secondary | ICD-10-CM | POA: Diagnosis present

## 2019-10-05 LAB — GASTROINTESTINAL PANEL BY PCR, STOOL (REPLACES STOOL CULTURE)

## 2019-10-05 LAB — COMPREHENSIVE METABOLIC PANEL
ALT: 27 U/L (ref 0–44)
AST: 89 U/L — ABNORMAL HIGH (ref 15–41)
Albumin: 1.9 g/dL — ABNORMAL LOW (ref 3.5–5.0)
Alkaline Phosphatase: 58 U/L (ref 38–126)
Anion gap: 9 (ref 5–15)
BUN: 52 mg/dL — ABNORMAL HIGH (ref 8–23)
CO2: 19 mmol/L — ABNORMAL LOW (ref 22–32)
Calcium: 8 mg/dL — ABNORMAL LOW (ref 8.9–10.3)
Chloride: 113 mmol/L — ABNORMAL HIGH (ref 98–111)
Creatinine, Ser: 1.43 mg/dL — ABNORMAL HIGH (ref 0.44–1.00)
GFR calc Af Amer: 38 mL/min — ABNORMAL LOW (ref >60)
GFR calc non Af Amer: 33 mL/min — ABNORMAL LOW (ref >60)
Glucose, Bld: 97 mg/dL (ref 70–99)
Potassium: 4 mmol/L (ref 3.5–5.1)
Sodium: 141 mmol/L (ref 135–145)
Total Bilirubin: 0.6 mg/dL (ref 0.3–1.2)
Total Protein: 5.3 g/dL — ABNORMAL LOW (ref 6.5–8.1)

## 2019-10-05 LAB — C DIFFICILE QUICK SCREEN W PCR REFLEX
C Diff antigen: NEGATIVE
C Diff interpretation: NOT DETECTED
C Diff toxin: NEGATIVE

## 2019-10-05 LAB — CBC
HCT: 23.6 % — ABNORMAL LOW (ref 36.0–46.0)
Hemoglobin: 7.3 g/dL — ABNORMAL LOW (ref 12.0–15.0)
MCH: 30.9 pg (ref 26.0–34.0)
MCHC: 30.9 g/dL (ref 30.0–36.0)
MCV: 100 fL (ref 80.0–100.0)
Platelets: 260 10*3/uL (ref 150–400)
RBC: 2.36 MIL/uL — ABNORMAL LOW (ref 3.87–5.11)
RDW: 13.6 % (ref 11.5–15.5)
WBC: 19.9 10*3/uL — ABNORMAL HIGH (ref 4.0–10.5)
nRBC: 0 % (ref 0.0–0.2)

## 2019-10-05 LAB — MRSA PCR SCREENING: MRSA by PCR: NEGATIVE

## 2019-10-05 LAB — CBG MONITORING, ED: Glucose-Capillary: 88 mg/dL (ref 70–99)

## 2019-10-05 LAB — GLUCOSE, CAPILLARY: Glucose-Capillary: 74 mg/dL (ref 70–99)

## 2019-10-05 LAB — LACTIC ACID, PLASMA
Lactic Acid, Venous: 2.9 mmol/L (ref 0.5–1.9)
Lactic Acid, Venous: 1.7 mmol/L (ref 0.5–1.9)

## 2019-10-05 LAB — MAGNESIUM: Magnesium: 1.7 mg/dL (ref 1.7–2.4)

## 2019-10-05 LAB — PHOSPHORUS: Phosphorus: 2.7 mg/dL (ref 2.5–4.6)

## 2019-10-05 MED ORDER — SIMVASTATIN 20 MG PO TABS
10.0000 mg | ORAL_TABLET | Freq: Every evening | ORAL | Status: DC
  Administered 2019-10-05 – 2019-10-12 (×8): 10 mg via ORAL
  Filled 2019-10-05: qty 2
  Filled 2019-10-05 (×5): qty 1
  Filled 2019-10-05: qty 2
  Filled 2019-10-05 (×2): qty 1

## 2019-10-05 MED ORDER — LATANOPROST 0.005 % OP SOLN
1.0000 [drp] | Freq: Every day | OPHTHALMIC | Status: DC
  Administered 2019-10-05 – 2019-10-12 (×9): 1 [drp] via OPHTHALMIC
  Filled 2019-10-05: qty 2.5

## 2019-10-05 MED ORDER — COLLAGENASE 250 UNIT/GM EX OINT
TOPICAL_OINTMENT | Freq: Every day | CUTANEOUS | Status: DC
  Filled 2019-10-05 (×2): qty 30

## 2019-10-05 MED ORDER — ACETAMINOPHEN 325 MG PO TABS
650.0000 mg | ORAL_TABLET | Freq: Four times a day (QID) | ORAL | Status: DC | PRN
  Administered 2019-10-05 – 2019-10-08 (×6): 650 mg via ORAL
  Filled 2019-10-05 (×7): qty 2

## 2019-10-05 MED ORDER — PHENYLEPHRINE HCL-NACL 10-0.9 MG/250ML-% IV SOLN
25.0000 ug/min | INTRAVENOUS | Status: DC
  Administered 2019-10-05: 25 ug/min via INTRAVENOUS
  Administered 2019-10-05: 18 ug/min via INTRAVENOUS
  Filled 2019-10-05 (×2): qty 250

## 2019-10-05 MED ORDER — ORAL CARE MOUTH RINSE
15.0000 mL | OROMUCOSAL | Status: DC
  Administered 2019-10-05 – 2019-10-13 (×59): 15 mL via OROMUCOSAL

## 2019-10-05 MED ORDER — CHLORHEXIDINE GLUCONATE 0.12% ORAL RINSE (MEDLINE KIT)
15.0000 mL | Freq: Two times a day (BID) | OROMUCOSAL | Status: DC
  Administered 2019-10-05 – 2019-10-13 (×17): 15 mL via OROMUCOSAL

## 2019-10-05 MED ORDER — CHLORHEXIDINE GLUCONATE CLOTH 2 % EX PADS
6.0000 | MEDICATED_PAD | Freq: Every day | CUTANEOUS | Status: DC
  Administered 2019-10-05 – 2019-10-12 (×8): 6 via TOPICAL

## 2019-10-05 MED ORDER — DORZOLAMIDE HCL-TIMOLOL MAL 2-0.5 % OP SOLN
1.0000 [drp] | Freq: Two times a day (BID) | OPHTHALMIC | Status: DC
  Administered 2019-10-05 – 2019-10-13 (×17): 1 [drp] via OPHTHALMIC
  Filled 2019-10-05: qty 10

## 2019-10-05 MED ORDER — ENOXAPARIN SODIUM 30 MG/0.3ML ~~LOC~~ SOLN
30.0000 mg | Freq: Every day | SUBCUTANEOUS | Status: DC
  Administered 2019-10-05 – 2019-10-06 (×2): 30 mg via SUBCUTANEOUS
  Filled 2019-10-05 (×2): qty 0.3

## 2019-10-05 MED ORDER — SODIUM CHLORIDE 0.9 % IV SOLN
250.0000 mL | INTRAVENOUS | Status: DC
  Administered 2019-10-05: 250 mL via INTRAVENOUS

## 2019-10-05 MED ORDER — DICLOFENAC SODIUM 1 % EX GEL
2.0000 g | Freq: Four times a day (QID) | CUTANEOUS | Status: DC | PRN
  Administered 2019-10-07: 2 g via TOPICAL
  Filled 2019-10-05: qty 100

## 2019-10-05 MED ORDER — SODIUM CHLORIDE 0.9 % IV SOLN: INTRAVENOUS | Status: DC

## 2019-10-05 MED ORDER — BRIMONIDINE TARTRATE 0.2 % OP SOLN
1.0000 [drp] | Freq: Three times a day (TID) | OPHTHALMIC | Status: DC
  Administered 2019-10-05 – 2019-10-13 (×25): 1 [drp] via OPHTHALMIC
  Filled 2019-10-05: qty 5

## 2019-10-05 MED ORDER — METOPROLOL TARTRATE 50 MG PO TABS
50.0000 mg | ORAL_TABLET | Freq: Two times a day (BID) | ORAL | Status: DC
  Administered 2019-10-05 – 2019-10-13 (×17): 50 mg via ORAL
  Filled 2019-10-05 (×7): qty 1
  Filled 2019-10-05 (×2): qty 4
  Filled 2019-10-05 (×3): qty 1
  Filled 2019-10-05: qty 4
  Filled 2019-10-05: qty 1
  Filled 2019-10-05: qty 4
  Filled 2019-10-05 (×2): qty 1

## 2019-10-05 NOTE — Progress Notes (Signed)
No measurement to be documented. value of 51 documented in error

## 2019-10-05 NOTE — Progress Notes (Signed)
NAME:  Heather Singleton, MRN:  [id number], DOB:  06/27/1930, LOS: 0 ADMISSION DATE:  10/04/2019, CONSULTATION DATE:  7/17 REFERRING MD:  EDP, CHIEF COMPLAINT:  sepsis   Brief History   86 yo female presented on 7/17 with diarrhea.  Has chronic sacral and heel pressure wounds.  Started on ABx as outpt by PCP for UTI.  In ER had low blood pressure, A fib with RVR, lactic acidosis, and altered mental status.  Required pressor agents and PCCM asked to admit to ICU.  Past Medical History  HTN, A fib, Constipation, GERD, HLD  Significant Hospital Events   7/17 Admit  Consults:    Procedures:    Significant Diagnostic Tests:   CT abd/pelvis 7/17 >> stool ball in rectosigmoid colon  CT head 7/17 >> atrophy, chronic small vessel ischemia  Micro Data:  Urine 7/17>>>  BC x2 7/17>>> C diff PCR 7/17 >> negative  Antimicrobials:  Cefepime 7/17>>> vanc 7/17>>>7/18  Interim history/subjective:  HR remains elevated.    Objective   Blood pressure 114/62, pulse (!) 42, temperature 98.8 F (37.1 C), resp. rate (!) 23, height 5\' 5"  (1.651 m), weight 78.2 kg, SpO2 95 %.        Intake/Output Summary (Last 24 hours) at 10/05/2019 1007 Last data filed at 10/05/2019 10/07/2019 Gross per 24 hour  Intake 5451.72 ml  Output 1350 ml  Net 4101.72 ml   Filed Weights   10/04/19 1949  Weight: 78.2 kg    Examination:  General - sleepy Eyes - crusting around eyes ENT - no sinus tenderness, no stridor Cardiac - irregular, tachycardic Chest - equal breath sounds b/l, no wheezing or rales Abdomen - soft, non tender, + bowel sounds Extremities - decreased muscle bulk Skin - pressure wounds on sacrum and heels Neuro - not following commands, able to speak but content not appropriate to situation  Resolved Hospital Problem list     Assessment & Plan:   Septic shock with lactic acidosis from UTI and decubitus ulcers. - wean pressors to keep MAP > 60 - continue IV fluids - day 2 of ABx; will  d/c vancomycin  Pressure wounds. - sacral, heels b/l >> all present on admission - wound care consulted  A fib with RVR, present on admission. Hx of HTN, HLD. - resume lopressor, zocor - hold outpt xarelto, lisinopril, HCTZ  AKI from hypovolemia. - uncertain what baseline renal fx is - continue IV fluids  Fecal impaction. - enema given in ER - bowel regimen  Acute metabolic encephalopathy 2nd to sepsis. - unsure what baseline mental/function status is, but seems poor  Anemia of critical illness and chronic disease. - f/u CBC  Open angle glaucoma b/l. - resume eye drops  Goals of care. - palliative care consulted   Best practice:  Diet: regular diet DVT prophylaxis: lovenox GI prophylaxis: n/a Mobility: BR Code Status: full Disposition: ICU  Labs    CMP Latest Ref Rng & Units 10/05/2019 10/04/2019 10/04/2019  Glucose 70 - 99 mg/dL 97 - 10/06/2019)  BUN 8 - 23 mg/dL 099(A) - 68(J)  Creatinine 0.44 - 1.00 mg/dL 34(M) - 6.84(A)  Sodium 135 - 145 mmol/L 141 143 139  Potassium 3.5 - 5.1 mmol/L 4.0 4.0 4.8  Chloride 98 - 111 mmol/L 113(H) - 105  CO2 22 - 32 mmol/L 19(L) - 21(L)  Calcium 8.9 - 10.3 mg/dL 8.0(L) - 9.0  Total Protein 6.5 - 8.1 g/dL 5.3(L) - 6.9  Total Bilirubin 0.3 - 1.2 mg/dL  0.6 - 1.3(H)  Alkaline Phos 38 - 126 U/L 58 - 63  AST 15 - 41 U/L 89(H) - 97(H)  ALT 0 - 44 U/L 27 - 29    CBC Latest Ref Rng & Units 10/05/2019 10/04/2019 10/04/2019  WBC 4.0 - 10.5 K/uL 19.9(H) - 17.8(H)  Hemoglobin 12.0 - 15.0 g/dL 7.3(L) 8.5(L) 11.5(L)  Hematocrit 36 - 46 % 23.6(L) 25.0(L) 35.3(L)  Platelets 150 - 400 K/uL 260 - 280    ABG    Component Value Date/Time   PHART 7.457 (H) 10/04/2019 2133   PCO2ART 25.0 (L) 10/04/2019 2133   PO2ART 114 (H) 10/04/2019 2133   HCO3 17.5 (L) 10/04/2019 2133   TCO2 18 (L) 10/04/2019 2133   ACIDBASEDEF 5.0 (H) 10/04/2019 2133   O2SAT 99.0 10/04/2019 2133    CBG (last 3)  Recent Labs    10/04/19 1913 10/05/19 0126  10/05/19 0223  GLUCAP 113* 88 74    Critical care time: 33 minutes  Coralyn Helling, MD [facility] Pager - [phone] 10/05/2019, 10:20 AM

## 2019-10-05 NOTE — Progress Notes (Addendum)
eLink Physician-Brief Progress Note Patient Name: DHANYA BOGLE DOB: 24-Jul-1930 MRN: [id number]   Date of Service  10/05/2019  HPI/Events of Note  ED MD discussed case and notified bed side ICU team to evaluate for Uroseptic shock , A fib RVR. Diarrhea.   86 year old female brought in via EMS with reports of diarrhea and weakness for the past 4 to 5 days. Now on Vent for above.  Data: Reviewed 7.45/25/114/18. LA 2.9 dwon from > 3.4, Hg 8.5 dropped from Hg 11.5  Cr 2. UA abnormal.  CT abd: fecal impaction and stercoral colitis. ? Enteritis. No hydronephrosis or stones.  CTH: neg  Camera: On Neosynephrine at 25, HR 112/ MAP > 65. decube ulcers. sats normal. Encephalopathy improving.  A/P: 1. Urosepsis/AKI, no obstructive uropathy or hydronephrosis. Decubital ulcer.  2. Encephalopathy.  3. A fib RVR, on Xarelto at home.   4. Anemia. Follow Hg closely and for any bleeding. On SUP.     eICU Interventions  - received EGDT, IV vanc/cefpime/metrogyl pending cultures - keep MAP > 65. - Vent bundle. Wean fio2. SBT/weaning trial in AM. - follow UOP. - BG goals < 180. - watch for GI bleeding. Consider GI consultation in AM. - NP H and P reviewed.       Intervention Category Major Interventions: Sepsis - evaluation and management;Arrhythmia - evaluation and management Minor Interventions: Communication with other healthcare providers and/or family Evaluation Type: New Patient Evaluation  Ranee Gosselin 10/05/2019, 12:14 AM

## 2019-10-05 NOTE — Consult Note (Signed)
Palliative Medicine Inpatient Consult Note  Reason for consult:  Goals of Care "failure to thrive, severe decub, husband likely unable to care for her at home"  HPI:  Per intake H&P --> Briefly, this is an 86 year old woman with history of HTN presenting with generalized weakness and diarrhea.  She is found to have sacral decubitus ulcers.  Recently was given outpatient antibiotics for UTI.  PCCM consulted for sepsis, elevated lactic acid borderline blood pressure.  Palliative care was asked to get involved to help with [place] conversations.   Clinical Assessment/Goals of Care: I have reviewed medical records including EPIC notes, labs and imaging, received report from bedside RN, assessed the patient.    I met with Latifa Noble (husband) and called Ruthann Cancer (family friend) to further discuss diagnosis prognosis, [place], EOL wishes, disposition and options.   I introduced Palliative Medicine as specialized medical care for people living with serious illness. It focuses on providing relief from the symptoms and stress of a serious illness. The goal is to improve quality of life for both the patient and the family.  I asked Mitzi Hansen to tell me about Reika. He shares that she is from [place], [place]. They have been married since 2016. Danett has a son who she has not seen or spoke to in five years he lives across the country. Missey has a daughter, Elmo Putt who is cared for at a group home. Nyriah is the legal guardian for her. 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 Anarely enjoys reading and watching television. She is religious and practices within the [place].  In terms of functional state Mitzi Hansen shares that Dula has been dependent on him for quite sometime. He is unable to remember the exact timeline though it sounds like in the last few months she has had increased debility. Prior to then she was able to mobilize independently, cook, clean, and shower herself. As of recently she has been  completely bedridden. Mitzi Hansen describes that he has been responsible for cleaning her up.   I reviewed that patient has a h/o HTN with patients husband though more importantly we discussed her debility, hypoalbuminemia, sacral ulcers. I shared with him that her outlook given all of these things is likely rather poor. He vocalized surprise by this. He asked that we keep doing all the we are doing that is within reason. I shared that as it stands right now she has a very high likelihood of rapid deterioration. He vocalized understanding of this  A detailed discussion was had today regarding advanced directives, there are non formally filed. From [place] perspective none have ever been completed.    Concepts specific to code status, artifical feeding and hydration, continued IV antibiotics and rehospitalization was had.   I completed a MOST form today. The patient and family outlined their wishes for the following treatment decisions:  Cardiopulmonary Resuscitation: Do Not Attempt Resuscitation (DNR/No CPR)  Medical Interventions: Limited Additional Interventions: Use medical treatment, IV fluids and cardiac monitoring as indicated, DO NOT USE intubation or mechanical ventilation. May consider use of less invasive airway support such as BiPAP or CPAP. Also provide comfort measures. Transfer to the hospital if indicated. Avoid intensive care.   Antibiotics: Antibiotics if indicated  IV Fluids: IV fluids if indicated  Feeding Tube: Feeding tube for a defined trial period   The difference between a aggressive medical intervention path  and a palliative comfort care path for this patient at this time was had. Mitzi Hansen and I discussed  a trial of therapies though if not significant improvements were made by Tuesday it would be valuable to further discuss hospice care.  I described hospice as a service for patients for have a life expectancy of < 74month. It preserves dignity and quality at the end phases of life.  The focus changes from curative to symptom relief.   Discussed the importance of continued conversation with family and their  medical providers regarding overall plan of care and treatment options, ensuring decisions are within the context of the patients values and GOCs.  Provided "Hard Choices for [employer] booklet.   Addendum: Spoke at length to [employer] Patient is enlisted as the legal guardian for her daughter, AElmo Puttwho lives at a group home. This is trivial as if no one else is appointed her daughter will receive guardianship through the state. Informed SW team.   Decision Maker: AGeralda Baumgardner(Husband) [phone] SUMMARY OF RECOMMENDATIONS   DNAR/DNI  MOST Completed, paper copy placed onto the chart electric copy can be found in the media section of epic  DNR Form Completed, paper copy placed onto the chart electric copy can be found in the media section of epic  Trial of ongoing therapy for the next 48 hours, if patient does not improve plan to further discuss hospice care  SW - Plan to reach out to HLyla Sonas patients daughter is cared for at a facility, if the patient dies her daughter has no legal guardian  PMT will provide ongoing support  Code Status/Advance Care Planning: DNAR/DNI   Palliative Prophylaxis:   Turn Q2H, Oral Care, ROM, Delirium precuations  Additional Recommendations (Limitations, Scope, Preferences):  Continue current scope of care  Psycho-social/Spiritual:   Desire for further Chaplaincy support: Yes -[facility] Additional Recommendations: Ongoing education.   Prognosis: Poor in the setting of debility and sepsis  Discharge Planning: Discharge plan is unclear  PPS: 10%    This conversation/these recommendations were discussed with patient primary care team, Dr. SHalford Chessman Time In: 1200 Time Out: 1320 Total Time: 80 Greater than 50%  of this time was spent counseling and coordinating care related to the above  assessment and plan.  MLone RockTeam Team Cell Phone: [phone] utilize secure chat with additional questions, if there is no response within 30 minutes please call the above phone number  Palliative Medicine Team providers are available by phone from 7am to 7pm daily and can be reached through the team cell phone.  Should this patient require assistance outside of these hours, please call the patient's attending physician.

## 2019-10-05 NOTE — Progress Notes (Signed)
Notified bedside nurse of need to order repeat lactic acid.  

## 2019-10-05 NOTE — Social Work (Signed)
CSW consulted by Elon Jester with Palliative and informed patient is the guardian of her daughter who resides in a home, where Dallie Piles is Astronomer. Considering patient's prognosis, there is a concern that the daughter may be without a guardian once patient expires. Patient's spouse Greig Castilla has been appropriate with patient's care but there is concern that he may be cognitively forgetful.  Palliative has made contact with the home daughter is in and made them aware of patient's medical status. TOC team will continue to assist with any needs.   Verlon Au, LCSWA Clinical Social Worker

## 2019-10-05 NOTE — Consult Note (Signed)
WOC Nurse Consult Note: Reason for Consult: Large area of necrotic tissue from sitting in a recliner chair continuously for serveral weeks/months. Patient's husband is hopeful that her skin is healing. He mentions that "due to his back" he is not able to lift her out of the chair. He seems to be forgetful or not able to grasp the seriousness of the skin ulcerations, described as "bedsores" for his ease in understanding. Patient's husband states he had a gel cushion for her but she could not use it. Wound type:Pressure plus sustained moisture, IAD Pressure Injury POA: Yes Measurement: Right IT, Unstageable PI: 7.5cm x 5cm area of black eschar Left IT, Unstageable PI: 7cm x 4.5cm area of black eschar Coccygeal/gluteal cleft Unstageable PI:15cm x 13cm area of black eschar Wound bed: Black adherent eschar to bilateral ischial tuberosities, black, soft eschar to the coccygeal and gluteal cleft wounds.  Drainage (amount, consistency, odor) mucus-like drainage on old dressings, suspect rectal and not wound related. Periwound: Macerated with peeling skin consistent with sustained exposure to urine and perhaps stool and the presence of fungal overgrowth. Posterior left thigh with caked stool in an area measuring 7cm x 10cm. Nursing is attempting to soften and remove this from the affected area.Posterior right thigh with partial thickness skin loss measuring 10cm x 10cm x 0.2cm.  Dressing procedure/placement/frequency: Three significant areas of pressure injury: coccygeal/gluteal cleft, bilateral ischial tuberosities.  Noted is conversation with Palliative Care. It is doubtful that the patient could heal these large pressure injuries.  Recommend CCS consultation to determine if hydrotherapy is indicated and to determine if any other form of debridement is preferred over enzymatic debridement using collagenase (e.g., sodium hypochlorite solution (Dakin's solution), surgical). Photography of wounds that are POA  would be helpful.  Recommend consideration of systemic antifungal (eg., Diflucan) as patient has indication of peeling tissue at hips, thighs indicative of sustained exposure to urine and fungal overgrowth.  WOC nursing team will not follow, but will remain available to this patient, the nursing and medical teams.  Please re-consult if needed. Thanks, Ladona Mow, MSN, RN, GNP, [facility]  Pager# [phone]

## 2019-10-06 DIAGNOSIS — B9620 Unspecified Escherichia coli [E. coli] as the cause of diseases classified elsewhere: Secondary | ICD-10-CM

## 2019-10-06 DIAGNOSIS — A4151 Sepsis due to Escherichia coli [E. coli]: Principal | ICD-10-CM

## 2019-10-06 DIAGNOSIS — N390 Urinary tract infection, site not specified: Secondary | ICD-10-CM

## 2019-10-06 DIAGNOSIS — N179 Acute kidney failure, unspecified: Secondary | ICD-10-CM

## 2019-10-06 DIAGNOSIS — G9340 Encephalopathy, unspecified: Secondary | ICD-10-CM

## 2019-10-06 LAB — BASIC METABOLIC PANEL
Anion gap: 7 (ref 5–15)
BUN: 32 mg/dL — ABNORMAL HIGH (ref 8–23)
CO2: 19 mmol/L — ABNORMAL LOW (ref 22–32)
Calcium: 7.7 mg/dL — ABNORMAL LOW (ref 8.9–10.3)
Chloride: 118 mmol/L — ABNORMAL HIGH (ref 98–111)
Creatinine, Ser: 1.15 mg/dL — ABNORMAL HIGH (ref 0.44–1.00)
GFR calc Af Amer: 50 mL/min — ABNORMAL LOW (ref >60)
GFR calc non Af Amer: 43 mL/min — ABNORMAL LOW (ref >60)
Glucose, Bld: 76 mg/dL (ref 70–99)
Potassium: 3.6 mmol/L (ref 3.5–5.1)
Sodium: 144 mmol/L (ref 135–145)

## 2019-10-06 LAB — URINE CULTURE: Culture: >=100000 — AB

## 2019-10-06 LAB — GLUCOSE, CAPILLARY: Glucose-Capillary: 87 mg/dL (ref 70–99)

## 2019-10-06 MED ORDER — ENOXAPARIN SODIUM 40 MG/0.4ML ~~LOC~~ SOLN
40.0000 mg | Freq: Every day | SUBCUTANEOUS | Status: DC
  Administered 2019-10-07 – 2019-10-08 (×2): 40 mg via SUBCUTANEOUS
  Filled 2019-10-06 (×2): qty 0.4

## 2019-10-06 MED ORDER — SODIUM CHLORIDE 0.9 % IV SOLN
2.0000 g | Freq: Two times a day (BID) | INTRAVENOUS | Status: DC
  Administered 2019-10-06: 2 g via INTRAVENOUS
  Filled 2019-10-06: qty 2

## 2019-10-06 MED ORDER — MAGNESIUM SULFATE 2 GM/50ML IV SOLN
2.0000 g | Freq: Once | INTRAVENOUS | Status: AC
  Administered 2019-10-06: 2 g via INTRAVENOUS
  Filled 2019-10-06: qty 50

## 2019-10-06 MED ORDER — MIDODRINE HCL 5 MG PO TABS
5.0000 mg | ORAL_TABLET | Freq: Three times a day (TID) | ORAL | Status: DC
  Administered 2019-10-06 – 2019-10-07 (×2): 5 mg via ORAL
  Filled 2019-10-06 (×2): qty 1

## 2019-10-06 MED ORDER — SODIUM CHLORIDE 0.9 % IV SOLN
2.0000 g | INTRAVENOUS | Status: AC
  Administered 2019-10-06 – 2019-10-10 (×5): 2 g via INTRAVENOUS
  Filled 2019-10-06: qty 2
  Filled 2019-10-06: qty 20
  Filled 2019-10-06: qty 2
  Filled 2019-10-06: qty 20
  Filled 2019-10-06 (×2): qty 2

## 2019-10-06 NOTE — Progress Notes (Signed)
eLink Physician-Brief Progress Note Patient Name: Heather Singleton DOB: [birth year] MRN: [id number]   Date of Service  10/06/2019  HPI/Events of Note  Notified of Mg 1.7 Creatinine 1.43  eICU Interventions  Magnesium 1 g ordered     Intervention Category Major Interventions: Electrolyte abnormality - evaluation and management  Darl Pikes 10/06/2019, 6:07 AM

## 2019-10-06 NOTE — Consult Note (Signed)
[facility] Surgery Consult Note  Heather Singleton [birth year]  [id number].    Requesting MD: Karie Fetch Chief Complaint/Reason for Consult: sacral wound  HPI:  Heather Singleton is an 87yo female PMH A fib (xarelto listed on home med list), HTN, HLD, and GERD who presented to [facility] 7/17 with generalized weakness and diarrhea. Patient is unable to give me any history. Per patient's nurse she lives at home with her husband who is her care giver. She was found to be septic with hypotension, a fib RVR, leukocytosis, and altered mental status. Urine culture positive for E coli UTI. She was admitted to the ICU. Started on cefepime and vancomycin. Initially required pressors but these have been weaned off. General surgery asked to evaluate wounds to ensure that these were not making her septic as well.   Review of Systems  Unable to perform ROS: Mental status change    History reviewed. No pertinent family history.  Past Medical History:  Diagnosis Date   Hypertension     Past Surgical History:  Procedure Laterality Date   ABDOMINAL HYSTERECTOMY      Social History:  reports that she has never smoked. She does not have any smokeless tobacco history on file. She reports that she does not drink alcohol. No history on file for drug use.  Allergies:  Allergies  Allergen Reactions   Latanoprost Shortness Of Breath    Had asthma as a child, and her MD told her that the medication could be causing her problems   Tomato Other (See Comments)    Acid reflux issues    Medications Prior to Admission  Medication Sig Dispense Refill   brimonidine (ALPHAGAN) 0.2 % ophthalmic solution Place 1 drop into both eyes 3 (three) times daily.      dexlansoprazole (DEXILANT) 60 MG capsule Take 60 mg by mouth daily.     diphenoxylate-atropine (LOMOTIL) 2.5-0.025 MG tablet Take 1 tablet by mouth 2 (two) times daily as needed for diarrhea or loose stools.      dorzolamide-timolol (COSOPT) 22.3-6.8  MG/ML ophthalmic solution Place 1 drop into both eyes 2 (two) times daily.     furosemide (LASIX) 20 MG tablet Take 20 mg by mouth daily as needed for fluid or edema.      latanoprost (XALATAN) 0.005 % ophthalmic solution Place 1 drop into both eyes at bedtime.     lisinopril-hydrochlorothiazide (PRINZIDE,ZESTORETIC) 10-12.5 MG tablet Take 1 tablet by mouth daily.     meclizine (ANTIVERT) 25 MG tablet Take 1 tablet (25 mg total) by mouth 3 (three) times daily as needed for dizziness. 30 tablet 0   Menthol (HALLS COUGH DROPS MT) Use as directed 1 lozenge in the mouth or throat daily as needed (for cough).     metoprolol succinate (TOPROL-XL) 25 MG 24 hr tablet Take 25 mg by mouth every morning.     nitrofurantoin, macrocrystal-monohydrate, (MACROBID) 100 MG capsule Take 100 mg by mouth 2 (two) times daily.      simvastatin (ZOCOR) 10 MG tablet Take 10 mg by mouth every evening.     XARELTO 20 MG TABS tablet Take 20 mg by mouth every evening.      Prior to Admission medications   Medication Sig Start Date End Date Taking? Authorizing Provider  brimonidine (ALPHAGAN) 0.2 % ophthalmic solution Place 1 drop into both eyes 3 (three) times daily.  09/24/19   [provider]  dexlansoprazole (DEXILANT) 60 MG capsule Take 60 mg by mouth daily.  [provider]  diphenoxylate-atropine (LOMOTIL) 2.5-0.025 MG tablet Take 1 tablet by mouth 2 (two) times daily as needed for diarrhea or loose stools.  10/02/19   [provider]  dorzolamide-timolol (COSOPT) 22.3-6.8 MG/ML ophthalmic solution Place 1 drop into both eyes 2 (two) times daily. 06/12/15   [provider]  furosemide (LASIX) 20 MG tablet Take 20 mg by mouth daily as needed for fluid or edema.  09/03/19   [provider]  latanoprost (XALATAN) 0.005 % ophthalmic solution Place 1 drop into both eyes at bedtime. 07/04/15   [provider]  lisinopril-hydrochlorothiazide (PRINZIDE,ZESTORETIC)  10-12.5 MG tablet Take 1 tablet by mouth daily. 05/29/15   [provider]  meclizine (ANTIVERT) 25 MG tablet Take 1 tablet (25 mg total) by mouth 3 (three) times daily as needed for dizziness. 07/22/15   Arby Barrette, MD  Menthol (HALLS COUGH DROPS MT) Use as directed 1 lozenge in the mouth or throat daily as needed (for cough).    [provider]  metoprolol succinate (TOPROL-XL) 25 MG 24 hr tablet Take 25 mg by mouth every morning. 06/15/15   [provider]  nitrofurantoin, macrocrystal-monohydrate, (MACROBID) 100 MG capsule Take 100 mg by mouth 2 (two) times daily.  09/25/19   [provider]  simvastatin (ZOCOR) 10 MG tablet Take 10 mg by mouth every evening. 05/29/15   [provider]  XARELTO 20 MG TABS tablet Take 20 mg by mouth every evening. 05/29/15   [provider]    Blood pressure (!) 91/44, pulse 92, temperature (!) 100.6 F (38.1 C), temperature source Bladder, resp. rate (!) 22, height 5\' 5"  (1.651 m), weight 78.2 kg, SpO2 95 %. Physical Exam: General: chronically ill appearing black female who is laying in bed in NAD HEENT: head is normocephalic, atraumatic.  Sclera are noninjected.  PERRL.  Ears and nose without any masses or lesions.  Mouth is pink and moist. ?[place] Heart: regular, rate, and rhythm.  Normal s1,s2. No obvious murmurs, gallops, or rubs noted.  Weak but palpable pedal pulses bilaterally  Lungs: CTAB, no wheezes, rhonchi, or rales noted.  Respiratory effort nonlabored Abd: soft, NT/ND, +BS, no masses, hernias, or organomegaly MS: calves soft and nontender, trace BLE edema Psych: Alert and oriented only to self Neuro: difficult to assess, does move all 4 extremities Skin: warm and dry -coccyx area: large area of necrotic tissue with surrounding skin that is macerated and almost appears like a burn, there is no purulent drainage   -left ischium: ~8x5cm area necrotic tissue   -right ischium: ~8x5cm area of  necrotic tissue     Results for orders placed or performed during the hospital encounter of 10/04/19 (from the past 48 hour(s))  CBG monitoring, ED     Status: Abnormal   Collection Time: 10/04/19  7:13 PM  Result Value Ref Range   Glucose-Capillary 113 (H) 70 - 99 mg/dL    Comment: Glucose reference range applies only to samples taken after fasting for at least 8 hours.  Comprehensive metabolic panel     Status: Abnormal   Collection Time: 10/04/19  7:52 PM  Result Value Ref Range   Sodium 139 135 - 145 mmol/L   Potassium 4.8 3.5 - 5.1 mmol/L   Chloride 105 98 - 111 mmol/L   CO2 21 (L) 22 - 32 mmol/L   Glucose, Bld 109 (H) 70 - 99 mg/dL    Comment: Glucose reference range applies only to samples taken after fasting for  at least 8 hours.   BUN 72 (H) 8 - 23 mg/dL   Creatinine, Ser 1.61 (H) 0.44 - 1.00 mg/dL   Calcium 9.0 8.9 - 09.6 mg/dL   Total Protein 6.9 6.5 - 8.1 g/dL   Albumin 2.8 (L) 3.5 - 5.0 g/dL   AST 97 (H) 15 - 41 U/L   ALT 29 0 - 44 U/L   Alkaline Phosphatase 63 38 - 126 U/L   Total Bilirubin 1.3 (H) 0.3 - 1.2 mg/dL   GFR calc non Af Amer 22 (L) >60 mL/min   GFR calc Af Amer 26 (L) >60 mL/min   Anion gap 13 5 - 15    Comment: Performed at [facility], [address] [place], [place] [address]  CBC WITH DIFFERENTIAL     Status: Abnormal   Collection Time: 10/04/19  7:52 PM  Result Value Ref Range   WBC 17.8 (H) 4.0 - 10.5 K/uL   RBC 3.60 (L) 3.87 - 5.11 MIL/uL   Hemoglobin 11.5 (L) 12.0 - 15.0 g/dL   HCT 98.1 (L) 36 - 46 %   MCV 98.1 80.0 - 100.0 fL   MCH 31.9 26.0 - 34.0 pg   MCHC 32.6 30.0 - 36.0 g/dL   RDW 19.1 47.8 - 29.5 %   Platelets 280 150 - 400 K/uL   nRBC 0.0 0.0 - 0.2 %   Neutrophils Relative % 87 %   Neutro Abs 15.5 (H) 1.7 - 7.7 K/uL   Lymphocytes Relative 5 %   Lymphs Abs 0.9 0.7 - 4.0 K/uL   Monocytes Relative 7 %   Monocytes Absolute 1.2 (H) 0 - 1 K/uL   Eosinophils Relative 0 %   Eosinophils Absolute 0.0 0 - 0 K/uL   Basophils  Relative 0 %   Basophils Absolute 0.1 0 - 0 K/uL   Immature Granulocytes 1 %   Abs Immature Granulocytes 0.15 (H) 0.00 - 0.07 K/uL    Comment: Performed at [facility], [address], [place], [place] [address]  APTT     Status: None   Collection Time: 10/04/19  7:52 PM  Result Value Ref Range   aPTT 33 24 - 36 seconds    Comment: Performed at [facility], [address] [place], [place] [address]  Protime-INR     Status: Abnormal   Collection Time: 10/04/19  7:52 PM  Result Value Ref Range   Prothrombin Time 19.6 (H) 11.4 - 15.2 seconds   INR 1.7 (H) 0.8 - 1.2    Comment: (NOTE) INR goal varies based on device and disease states. Performed at [facility], [address], [place], [place] [address]   Urinalysis, Routine w reflex microscopic     Status: Abnormal   Collection Time: 10/04/19  7:52 PM  Result Value Ref Range   Color, Urine AMBER (A) YELLOW    Comment: BIOCHEMICALS MAY BE AFFECTED BY COLOR   APPearance CLOUDY (A) CLEAR   Specific Gravity, Urine 1.016 1.005 - 1.030   pH 5.0 5.0 - 8.0   Glucose, UA NEGATIVE NEGATIVE mg/dL   Hgb urine dipstick MODERATE (A) NEGATIVE   Bilirubin Urine NEGATIVE NEGATIVE   Ketones, ur NEGATIVE NEGATIVE mg/dL   Protein, ur 30 (A) NEGATIVE mg/dL   Nitrite POSITIVE (A) NEGATIVE   Leukocytes,Ua LARGE (A) NEGATIVE   RBC / HPF 21-50 0 - 5 RBC/hpf   WBC, UA >50 (H) 0 - 5 WBC/hpf   Bacteria, UA  MANY (A) NONE SEEN   Squamous Epithelial / LPF 0-5 0 - 5   Mucus PRESENT     Comment: Performed at [facility], [address], [place], [place] [address]  Urine culture     Status: Abnormal   Collection Time: 10/04/19  7:52 PM   Specimen: In/Out Cath Urine  Result Value Ref Range   Specimen Description IN/OUT CATH URINE    Special Requests      NONE Performed at [facility], [address], [place], [place] [address]    Culture >=100,000 COLONIES/mL ESCHERICHIA COLI (A)    Report Status 10/06/2019 FINAL     Organism ID, Bacteria ESCHERICHIA COLI (A)       Susceptibility   Escherichia coli - MIC*    AMPICILLIN 4 SENSITIVE Sensitive     CEFAZOLIN <=4 SENSITIVE Sensitive     CEFTRIAXONE <=0.25 SENSITIVE Sensitive     CIPROFLOXACIN <=0.25 SENSITIVE Sensitive     GENTAMICIN <=1 SENSITIVE Sensitive     IMIPENEM <=0.25 SENSITIVE Sensitive     NITROFURANTOIN 128 RESISTANT Resistant     TRIMETH/SULFA <=20 SENSITIVE Sensitive     AMPICILLIN/SULBACTAM <=2 SENSITIVE Sensitive     PIP/TAZO <=4 SENSITIVE Sensitive     * >=100,000 COLONIES/mL ESCHERICHIA COLI  Blood Culture (routine x 2)     Status: None (Preliminary result)   Collection Time: 10/04/19  7:58 PM   Specimen: BLOOD  Result Value Ref Range   Specimen Description BLOOD SITE NOT SPECIFIED    Special Requests      BOTTLES DRAWN AEROBIC ONLY Blood Culture results may not be optimal due to an inadequate volume of blood received in culture bottles   Culture      NO GROWTH < 24 HOURS Performed at [facility], [address] [place], [place] [address]    Report Status PENDING   SARS Coronavirus 2 by RT PCR (hospital order, performed in [facility] hospital lab) Nasopharyngeal Nasopharyngeal Swab     Status: None   Collection Time: 10/04/19  7:58 PM   Specimen: Nasopharyngeal Swab  Result Value Ref Range   SARS Coronavirus 2 NEGATIVE NEGATIVE    Comment: (NOTE) SARS-CoV-2 target nucleic acids are NOT DETECTED.  The SARS-CoV-2 RNA is generally detectable in upper and lower respiratory specimens during the acute phase of infection. The lowest concentration of SARS-CoV-2 viral copies this assay can detect is 250 copies / mL. A negative result does not preclude SARS-CoV-2 infection and should not be used as the sole basis for treatment or other patient management decisions.  A negative result may occur with improper specimen collection / handling, submission of specimen other than nasopharyngeal swab, presence of viral mutation(s)  within the areas targeted by this assay, and inadequate number of viral copies (<250 copies / mL). A negative result must be combined with clinical observations, patient history, and epidemiological information.  Fact Sheet for Patients:   BoilerBrush.com.cy  Fact Sheet for Healthcare Providers: https://pope.com/  This test is not yet approved or  cleared by the [place] FDA and has been authorized for detection and/or diagnosis of SARS-CoV-2 by FDA under an Emergency Use Authorization (EUA).  This EUA will remain in effect (meaning this test can be used) for the duration of the COVID-19 declaration under Section 564(b)(1) of the Act, 21 U.S.C. section 360bbb-3(b)(1), unless the authorization is terminated or revoked sooner.  Performed at [facility], [address], [place], [place]  [address]   Blood Culture (routine x 2)     Status: None (Preliminary result)   Collection Time: 10/04/19  8:00 PM   Specimen: BLOOD RIGHT ARM  Result Value Ref Range   Specimen Description BLOOD RIGHT ARM    Special Requests      BOTTLES DRAWN AEROBIC AND ANAEROBIC Blood Culture results may not be optimal due to an inadequate volume of blood received in culture bottles   Culture      NO GROWTH < 24 HOURS Performed at [facility], [address] [place], [place] [address]    Report Status PENDING   Lactic acid, plasma     Status: Abnormal   Collection Time: 10/04/19  8:22 PM  Result Value Ref Range   Lactic Acid, Venous 3.4 (HH) 0.5 - 1.9 mmol/L    Comment: CRITICAL RESULT CALLED TO, READ BACK BY AND VERIFIED WITH: [facility] 10/04/19 2107 [id number] Performed at [facility], [address] [place], [place] [address]   Gastrointestinal Panel by PCR , Stool     Status: None   Collection Time: 10/04/19  9:03 PM   Specimen: STOOL  Result Value Ref Range   Campylobacter species NOT DETECTED NOT DETECTED   Plesimonas  shigelloides NOT DETECTED NOT DETECTED   Salmonella species NOT DETECTED NOT DETECTED   Yersinia enterocolitica NOT DETECTED NOT DETECTED   Vibrio species NOT DETECTED NOT DETECTED   Vibrio cholerae NOT DETECTED NOT DETECTED   Enteroaggregative E coli (EAEC) NOT DETECTED NOT DETECTED   Enteropathogenic E coli (EPEC) NOT DETECTED NOT DETECTED   Enterotoxigenic E coli (ETEC) NOT DETECTED NOT DETECTED   Shiga like toxin producing E coli (STEC) NOT DETECTED NOT DETECTED   Shigella/Enteroinvasive E coli (EIEC) NOT DETECTED NOT DETECTED   Cryptosporidium NOT DETECTED NOT DETECTED   Cyclospora cayetanensis NOT DETECTED NOT DETECTED   Entamoeba histolytica NOT DETECTED NOT DETECTED   Giardia lamblia NOT DETECTED NOT DETECTED   Adenovirus F40/41 NOT DETECTED NOT DETECTED   Astrovirus NOT DETECTED NOT DETECTED   Norovirus GI/GII NOT DETECTED NOT DETECTED   Rotavirus A NOT DETECTED NOT DETECTED   Sapovirus (I, II, IV, and V) NOT DETECTED NOT DETECTED    Comment: Performed at [facility], [address] [place], [place] [address]  C Difficile Quick Screen w PCR reflex     Status: None   Collection Time: 10/04/19  9:03 PM   Specimen: STOOL  Result Value Ref Range   C Diff antigen NEGATIVE NEGATIVE   C Diff toxin NEGATIVE NEGATIVE   C Diff interpretation No C. difficile detected.     Comment: Performed at [facility], [address] [place], [place] [address]  I-Stat arterial blood gas, [facility] ED)     Status: Abnormal   Collection Time: 10/04/19  9:33 PM  Result Value Ref Range   pH, Arterial 7.457 (H) 7.35 - 7.45   pCO2 arterial 25.0 (L) 32 - 48 mmHg   pO2, Arterial 114 (H) 83 - 108 mmHg   Bicarbonate 17.5 (L) 20.0 - 28.0 mmol/L   TCO2 18 (L) 22 - 32 mmol/L   O2 Saturation 99.0 %   Acid-base deficit 5.0 (H) 0.0 - 2.0 mmol/L   Sodium 143 135 - 145 mmol/L   Potassium 4.0 3.5 - 5.1 mmol/L   Calcium, Ion 1.14 (L) 1.15 - 1.40 mmol/L   HCT 25.0 (L) 36 - 46 %   Hemoglobin 8.5  (L) 12.0 -  15.0 g/dL   Patient temperature 161.0 F    Collection site Radial    Drawn by RT    Sample type ARTERIAL   Lactic acid, plasma     Status: Abnormal   Collection Time: 10/04/19 10:39 PM  Result Value Ref Range   Lactic Acid, Venous 3.4 (HH) 0.5 - 1.9 mmol/L    Comment: CRITICAL VALUE NOTED.  VALUE IS CONSISTENT WITH PREVIOUSLY REPORTED AND CALLED VALUE. Performed at [facility], [address], [place], [place] [address]   Lactic acid, plasma     Status: Abnormal   Collection Time: 10/05/19  1:23 AM  Result Value Ref Range   Lactic Acid, Venous 2.9 (HH) 0.5 - 1.9 mmol/L    Comment: CRITICAL VALUE NOTED.  VALUE IS CONSISTENT WITH PREVIOUSLY REPORTED AND CALLED VALUE. Performed at [facility], [address] [place], [place] [address]   CBG monitoring, ED     Status: None   Collection Time: 10/05/19  1:26 AM  Result Value Ref Range   Glucose-Capillary 88 70 - 99 mg/dL    Comment: Glucose reference range applies only to samples taken after fasting for at least 8 hours.  MRSA PCR Screening     Status: None   Collection Time: 10/05/19  2:04 AM   Specimen: Nasal Mucosa; Nasopharyngeal  Result Value Ref Range   MRSA by PCR NEGATIVE NEGATIVE    Comment:        The GeneXpert MRSA Assay (FDA approved for NASAL specimens only), is one component of a comprehensive MRSA colonization surveillance program. It is not intended to diagnose MRSA infection nor to guide or monitor treatment for MRSA infections. Performed at [facility], [address], [place], [place] [address]   Glucose, capillary     Status: None   Collection Time: 10/05/19  2:23 AM  Result Value Ref Range   Glucose-Capillary 74 70 - 99 mg/dL    Comment: Glucose reference range applies only to samples taken after fasting for at least 8 hours.  CBC     Status: Abnormal   Collection Time: 10/05/19  6:23 AM  Result Value Ref Range   WBC 19.9 (H) 4.0 - 10.5 K/uL   RBC 2.36 (L) 3.87 - 5.11  MIL/uL   Hemoglobin 7.3 (L) 12.0 - 15.0 g/dL    Comment: REPEATED TO VERIFY DELTA CHECK NOTED    HCT 23.6 (L) 36 - 46 %   MCV 100.0 80.0 - 100.0 fL   MCH 30.9 26.0 - 34.0 pg   MCHC 30.9 30.0 - 36.0 g/dL   RDW 82.9 56.2 - 13.0 %   Platelets 260 150 - 400 K/uL   nRBC 0.0 0.0 - 0.2 %    Comment: Performed at [facility], [address], [place], [place] [address]  Magnesium     Status: None   Collection Time: 10/05/19  6:23 AM  Result Value Ref Range   Magnesium 1.7 1.7 - 2.4 mg/dL    Comment: Performed at [facility], [address], [place], [place] [address]  Phosphorus     Status: None   Collection Time: 10/05/19  6:23 AM  Result Value Ref Range   Phosphorus 2.7 2.5 - 4.6 mg/dL    Comment: Performed at [facility], [address] [place], [place] [address]  Lactic acid, plasma     Status: None   Collection Time: 10/05/19  6:23 AM  Result Value  Ref Range   Lactic Acid, Venous 1.7 0.5 - 1.9 mmol/L    Comment: Performed at [facility], [address], [place], [place] [address]  Comprehensive metabolic panel     Status: Abnormal   Collection Time: 10/05/19  6:23 AM  Result Value Ref Range   Sodium 141 135 - 145 mmol/L   Potassium 4.0 3.5 - 5.1 mmol/L   Chloride 113 (H) 98 - 111 mmol/L   CO2 19 (L) 22 - 32 mmol/L   Glucose, Bld 97 70 - 99 mg/dL    Comment: Glucose reference range applies only to samples taken after fasting for at least 8 hours.   BUN 52 (H) 8 - 23 mg/dL   Creatinine, Ser 6.04 (H) 0.44 - 1.00 mg/dL   Calcium 8.0 (L) 8.9 - 10.3 mg/dL   Total Protein 5.3 (L) 6.5 - 8.1 g/dL   Albumin 1.9 (L) 3.5 - 5.0 g/dL   AST 89 (H) 15 - 41 U/L   ALT 27 0 - 44 U/L   Alkaline Phosphatase 58 38 - 126 U/L   Total Bilirubin 0.6 0.3 - 1.2 mg/dL   GFR calc non Af Amer 33 (L) >60 mL/min   GFR calc Af Amer 38 (L) >60 mL/min   Anion gap 9 5 - 15    Comment: Performed at [facility], [address], [place], [place] [address]  Glucose, capillary      Status: None   Collection Time: 10/06/19  7:45 AM  Result Value Ref Range   Glucose-Capillary 87 70 - 99 mg/dL    Comment: Glucose reference range applies only to samples taken after fasting for at least 8 hours.  Basic metabolic panel     Status: Abnormal   Collection Time: 10/06/19  8:40 AM  Result Value Ref Range   Sodium 144 135 - 145 mmol/L   Potassium 3.6 3.5 - 5.1 mmol/L   Chloride 118 (H) 98 - 111 mmol/L   CO2 19 (L) 22 - 32 mmol/L   Glucose, Bld 76 70 - 99 mg/dL    Comment: Glucose reference range applies only to samples taken after fasting for at least 8 hours.   BUN 32 (H) 8 - 23 mg/dL   Creatinine, Ser 1.19 (H) 0.44 - 1.00 mg/dL   Calcium 7.7 (L) 8.9 - 10.3 mg/dL   GFR calc non Af Amer 43 (L) >60 mL/min   GFR calc Af Amer 50 (L) >60 mL/min   Anion gap 7 5 - 15    Comment: Performed at [facility], [address], [place], [place] [address]   CT ABDOMEN PELVIS WO CONTRAST  Result Date: 10/04/2019 CLINICAL DATA:  86 year old female complaining of diarrhea for the past 4-5 days EXAM: CT ABDOMEN AND PELVIS WITHOUT CONTRAST TECHNIQUE: Multidetector CT imaging of the abdomen and pelvis was performed following the standard protocol without IV contrast. COMPARISON:  None. FINDINGS: Lower chest: Atelectatic changes in the lung bases. Mild cardiomegaly. Hypoattenuation of the cardiac blood pool relative to the myocardium compatible with anemia. Few calcifications on the aortic leaflets as well as coronary artery calcifications. No pericardial effusion. Hepatobiliary: No discernible hepatic lesions on this unenhanced CT further limited by extensive respiratory motion most pronounced in the upper abdomen. Normal hepatic attenuation. Smooth liver surface contour. No gross abnormality of the gallbladder biliary tree without visible calcified gallstones. Pancreas: Unremarkable. No pancreatic ductal dilatation or surrounding inflammatory changes. Spleen: Normal in size without focal  abnormality.  Adrenals/Urinary Tract: Mild lobular thickening of the adrenal glands without concerning dominant nodules. Fluid attenuation cyst present in the lower pole right kidney measuring up to 2.6 cm in size. Few coarse upper pole calcifications in the right kidney and punctate lower pole calcification in the left kidney without evidence of obstructive urolithiasis or hydronephrosis. Bladder partially decompressed by Foley catheter with intraluminal gas likely related to instrumentation Stomach/Bowel: Distal esophagus, stomach and duodenum are grossly unremarkable accounting for decompression and extensive motion artifact. Few clustered loops of small bowel in the left upper abdomen with some surrounding hazy mesenteric stranding versus motion artifact. Much of the remaining small bowel is unremarkable. No evidence of obstruction. A normal appendix is visualized. Moderate colonic stool burden. No proximal colonic thickening or dilatation. The rectosigmoid contains an inspissated stool ball measuring up to 6.2 cm in diameter with some circumferential mural thickening and perirectal/mesorectal fat stranding and presacral thickening. Vascular/Lymphatic: Atherosclerotic calcifications within the abdominal aorta and branch vessels. No aneurysm or ectasia. No enlarged abdominopelvic lymph nodes within the limitations of this motion degraded exam. Reproductive: Uterus is surgically absent. No concerning adnexal lesions. Other: Trace free fluid in the pelvis likely related to inflammatory changes of the bowel, as detailed above. Small ventral diastasis/broad-based umbilical hernia with Richter's type protrusion of the anti mesenteric wall of the transverse colon without evidence of resulting obstruction or vascular compromise. No other bowel containing hernia. Musculoskeletal: The osseous structures appear diffusely demineralized which may limit detection of small or nondisplaced fractures. No acute osseous abnormality  or suspicious osseous lesion. Multilevel degenerative changes are present in the imaged portions of the spine. Additional degenerative changes of the hips and pelvis. IMPRESSION: 1. The rectosigmoid contains an inspissated stool ball measuring up to 6.2 cm in diameter with some circumferential mural thickening and perirectal/mesorectal fat stranding and presacral thickening. Findings are concerning for fecal impaction and stercoral colitis. 2. Few clustered loops of small bowel in the left upper abdomen with some surrounding hazy mesenteric stranding versus motion artifact. This finding is nonspecific but may reflect an enteritis. 3. Small ventral diastasis/broad-based umbilical hernia with Richter's type protrusion of the anti mesenteric wall of the transverse colon without evidence of resulting obstruction or vascular compromise. 4. Aortic Atherosclerosis (ICD10-I70.0). Electronically Signed   By: Kreg ShropshirePrice  DeHay M.D.   On: 10/04/2019 22:23   CT Head Wo Contrast  Result Date: 10/04/2019 CLINICAL DATA:  Altered mental status EXAM: CT HEAD WITHOUT CONTRAST TECHNIQUE: Contiguous axial images were obtained from the base of the skull through the vertex without intravenous contrast. COMPARISON:  Jul 22, 2015 FINDINGS: Brain: No evidence of acute territorial infarction, hemorrhage, hydrocephalus,extra-axial collection or mass lesion/mass effect. There is dilatation the ventricles and sulci consistent with age-related atrophy. Low-attenuation changes in the deep white matter consistent with small vessel ischemia. 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 Vascular: No hyperdense vessel or unexpected calcification. Skull: The skull is intact. No fracture or focal lesion identified. Sinuses/Orbits: The visualized paranasal sinuses and mastoid air cells are clear. The orbits and globes intact. Other: None IMPRESSION: No acute intracranial abnormality. Findings consistent with age related atrophy and chronic small vessel  ischemia Electronically Signed   By: Jonna ClarkBindu  Avutu M.D.   On: 10/04/2019 22:17   DG Chest Port 1 View  Result Date: 10/04/2019 CLINICAL DATA:  Weakness. EXAM: PORTABLE CHEST 1 VIEW COMPARISON:  September 28, 2009 FINDINGS: The lungs are hyperinflated. Mild, diffuse chronic appearing increased lung markings are seen. There is no evidence of acute  infiltrate, pleural effusion or pneumothorax. The cardiac silhouette is moderately enlarged. There is mild calcification of the aortic arch. The visualized skeletal structures are unremarkable. IMPRESSION: No active disease. Electronically Signed   By: Aram Candela M.D.   On: 10/04/2019 20:49   Anti-infectives (From admission, onward)   Start     Dose/Rate Route Frequency Ordered Stop   10/06/19 1145  ceFEPIme (MAXIPIME) 2 g in sodium chloride 0.9 % 100 mL IVPB     Discontinue     2 g 200 mL/hr over 30 Minutes Intravenous Every 12 hours 10/06/19 1052     10/05/19 2100  vancomycin (VANCOREADY) IVPB 750 mg/150 mL  Status:  Discontinued        750 mg 150 mL/hr over 60 Minutes Intravenous Every 24 hours 10/04/19 2134 10/05/19 1007   10/05/19 2030  ceFEPIme (MAXIPIME) 2 g in sodium chloride 0.9 % 100 mL IVPB  Status:  Discontinued        2 g 200 mL/hr over 30 Minutes Intravenous Every 24 hours 10/04/19 2134 10/06/19 1052   10/04/19 2000  vancomycin (VANCOREADY) IVPB 1500 mg/300 mL        1,500 mg 150 mL/hr over 120 Minutes Intravenous  Once 10/04/19 1950 10/04/19 2254   10/04/19 1930  ceFEPIme (MAXIPIME) 2 g in sodium chloride 0.9 % 100 mL IVPB        2 g 200 mL/hr over 30 Minutes Intravenous  Once 10/04/19 1927 10/04/19 2118   10/04/19 1930  metroNIDAZOLE (FLAGYL) IVPB 500 mg        500 mg 100 mL/hr over 60 Minutes Intravenous  Once 10/04/19 1927 10/04/19 2136   10/04/19 1930  vancomycin (VANCOCIN) IVPB 1000 mg/200 mL premix  Status:  Discontinued        1,000 mg 200 mL/hr over 60 Minutes Intravenous  Once 10/04/19 1927 10/04/19 1950         Assessment/Plan A fib (xarelto listed on home med list) HTN HLD GERD Sepsis, E colic UTI  Unstageable pressure wounds to the coccyx, left and right ischial tuberosities - I do not think that her wounds are making her septic. She needs good wound care, to keep the area clean from stool, and to keep pressure off the wounds. Air mattress has been ordered. Continue frequent turning. Would not recommend any surgical debridement at this time as she likely would not heal these areas. Continue santyl to necrotic areas and add xeroform/petroleum gauze to help soften the necrotic tissue and to protect the macerated areas. If patient improves and leaves the hospital she could benefit from referral to the outpatient wound clinic. General surgery will sign off, please call if we can be of further assistance.   ID - cefepime/vancomycin VTE - SCDs, lovenox FEN - reg diet Foley - in place Follow up - wound clinic  Franne Forts, [facility] 10/06/2019, 12:21 PM Please see Amion for pager number during day hours 7:00am-4:30pm

## 2019-10-06 NOTE — Progress Notes (Signed)
Pharmacy Antibiotic Note  Heather Singleton is a 86 y.o. female admitted on 10/04/2019 with sepsis thought to be due to UTI and decubitus ulcers.  Pharmacy has been consulted for cefepime dosing. Patient has been receiving cefepime 2g IV q24h. Scr improved from 2.0 on admission to 1.15 today with current CrCl 36 ml/min. WBC 19.9. Tmax 102.   Height: 5\' 5"  (165.1 cm) Weight: 78.2 kg (172 lb 6.4 oz) IBW/kg (Calculated) : 57  Temp (24hrs), Avg:100 F (37.8 C), Min:99.3 F (37.4 C), Max:102 F (38.9 C)  Recent Labs  Lab 10/04/19 1952 10/04/19 2022 10/04/19 2239 10/05/19 0123 10/05/19 0623 10/06/19 0840  WBC 17.8*  --   --   --  19.9*  --   CREATININE 2.00*  --   --   --  1.43* 1.15*  LATICACIDVEN  --  3.4* 3.4* 2.9* 1.7  --     Estimated Creatinine Clearance: 36.3 mL/min (A) (by C-G formula based on SCr of 1.15 mg/dL (H)).    Allergies  Allergen Reactions  . Latanoprost Shortness Of Breath    Had asthma as a child, and her MD told her that the medication could be causing her problems  . Tomato Other (See Comments)    Acid reflux issues    Antimicrobials this admission: 7/17 Cefepime >>  7/17 Vancomycin >> 7/18   Dose adjustments this admission: 7/19: Cefepime 2g q24h changed to 2g q12h based on improved renal function   Microbiology results: 7/17 covid - negative 7/17 C.diff - negative 7/17 GI panel - negative  7/17 UCx - 100k e. Coli (sensitive except resistant macrobid) 7/17 BCx - ngtd  7/18 MRSA PCR - negative  Plan:  Adjust cefepime to 2g IV q12h  Monitor renal function Follow up length of therapy and de-escalation as appropriate   Thank you for allowing pharmacy to be a part of this patient's care.  8/18, PharmD Clinical Pharmacist  10/06/2019 10:48 AM

## 2019-10-06 NOTE — Progress Notes (Addendum)
NAME:  Heather Singleton, MRN:  [id number], DOB:  [birth year], LOS: 1 ADMISSION DATE:  10/04/2019, CONSULTATION DATE:  7/17 REFERRING MD:  EDP, CHIEF COMPLAINT:  sepsis   Brief History   86 yo female presented on 7/17 with diarrhea.  Has chronic sacral and heel pressure wounds.  Started on ABx as outpt by PCP for UTI.  In ER had low blood pressure, A fib with RVR, lactic acidosis, and altered mental status.  Required pressor agents and PCCM asked to admit to ICU.  Past Medical History  HTN, A fib, Constipation, GERD, HLD  Significant Hospital Events   7/17 Admit 7/18 off pressors. Still w/ RVR. Palliative consulted; DNR/DNI 7/19 ready for transfer out of ICU. Surgery consulted based on Wound rec to determine best course of debridement.  Consults:    Procedures:    Significant Diagnostic Tests:   CT abd/pelvis 7/17 >> stool ball in rectosigmoid colon  CT head 7/17 >> atrophy, chronic small vessel ischemia  Micro Data:  Urine 7/17>>> ecoli>>> BC x2 7/17>>> C diff PCR 7/17 >> negative  Antimicrobials:  Cefepime 7/17>>> vanc 7/17>>>7/18  Interim history/subjective:  stable  Objective   Blood pressure (Abnormal) 90/51, pulse 93, temperature 99.7 F (37.6 C), resp. rate (Abnormal) 21, height 5\' 5"  (1.651 m), weight 78.2 kg, SpO2 98 %.        Intake/Output Summary (Last 24 hours) at 10/06/2019 0938 Last data filed at 10/06/2019 0700 Gross per 24 hour  Intake 2155.62 ml  Output 1100 ml  Net 1055.62 ml   Filed Weights   10/04/19 1949  Weight: 78.2 kg    Examination: General this is an 86 year female resting in bed. She is in no distress HENT edentulous MMM no JVD pulm clear no accessory use. On room air  Card regular irreg w/ afib on tele abd not tender + bowel sounds Ext warm and dry decreased muscle bulk Derm pressure ulcers to bilateral IT and sacrum not able to stage.  Neuro. Confused. Pleasant and cooperative. Moves all ext   Resolved Hospital Problem list     Fecal impaction.- enema given in ER (resolved) RVR resolved Assessment & Plan:   Septic shock with lactic acidosis from e-coli UTI and decubitus ulcers. -off pressors  -susceptibilities still pending Plan Keep euvolemic Day 3 cefepime. Await sensitivities and narrow as indicated.   Pressure wounds (unstagable wounds on bilateral IT, and cocc ygeal/gluteal cleft. Also has sig macerated area to both posterior thighs) - sacral, heels b/l >> all present on admission - wound care consulted Plan CCS eval to determine if hydrotherapy vs other form of debridement indicated.   Chronic afib  Hx of HTN, HLD. Plan Cont lopressor and zocor Holding HCTZ and lisinopril for now hgb had drifted down. When/if to resume xarelto TBD -->if hbg stable and no surgical debridement indicated then perhaps next 24-48hrs  AKI from hypovolemia. - uncertain what baseline renal fx -was improving. Current chemistry still pending Plan F/u pending chemistry Renal dose meds  Acute metabolic encephalopathy 2nd to sepsis. Seems as though there is probable element of dementia as well; but not clear what her baseline fxn is  Plan Supportive care  Anemia of critical illness and chronic disease. -hgb had drifted down to 7.3 Plan Trend cbc; recheck in am   Open angle glaucoma b/l. Plan Cont eye gtts   Failure to thrive. Severe Protein calorie malnutrition and immobility Goals of care. - palliative care consulted Plan DNR/DNI.  Family working w/  palliative    Best practice:  Diet: regular diet DVT prophylaxis: lovenox GI prophylaxis: n/a Mobility: BR Code Status: full Disposition: ICU  Labs    CMP Latest Ref Rng & Units 10/06/2019 10/05/2019 10/04/2019  Glucose 70 - 99 mg/dL 76 97 -  BUN 8 - 23 mg/dL 70(J) 62(E) -  Creatinine 0.44 - 1.00 mg/dL 3.66(Q) 9.47(M) -  Sodium 135 - 145 mmol/L 144 141 143  Potassium 3.5 - 5.1 mmol/L 3.6 4.0 4.0  Chloride 98 - 111 mmol/L 118(H) 113(H) -  CO2 22 -  32 mmol/L 19(L) 19(L) -  Calcium 8.9 - 10.3 mg/dL 7.7(L) 8.0(L) -  Total Protein 6.5 - 8.1 g/dL - 5.3(L) -  Total Bilirubin 0.3 - 1.2 mg/dL - 0.6 -  Alkaline Phos 38 - 126 U/L - 58 -  AST 15 - 41 U/L - 89(H) -  ALT 0 - 44 U/L - 27 -    CBC Latest Ref Rng & Units 10/05/2019 10/04/2019 10/04/2019  WBC 4.0 - 10.5 K/uL 19.9(H) - 17.8(H)  Hemoglobin 12.0 - 15.0 g/dL 7.3(L) 8.5(L) 11.5(L)  Hematocrit 36 - 46 % 23.6(L) 25.0(L) 35.3(L)  Platelets 150 - 400 K/uL 260 - 280    ABG    Component Value Date/Time   PHART 7.457 (H) 10/04/2019 2133   PCO2ART 25.0 (L) 10/04/2019 2133   PO2ART 114 (H) 10/04/2019 2133   HCO3 17.5 (L) 10/04/2019 2133   TCO2 18 (L) 10/04/2019 2133   ACIDBASEDEF 5.0 (H) 10/04/2019 2133   O2SAT 99.0 10/04/2019 2133    CBG (last 3)  Recent Labs    10/05/19 0126 10/05/19 0223 10/06/19 0745  GLUCAP 88 74 87    Critical care time: NA  Simonne Martinet ACNP-BC [facility] Pulmonary/Critical Care Pager # [phone] OR # [phone] if no answer

## 2019-10-07 ENCOUNTER — Inpatient Hospital Stay (HOSPITAL_COMMUNITY): Payer: Medicare PPO

## 2019-10-07 DIAGNOSIS — E43 Unspecified severe protein-calorie malnutrition: Secondary | ICD-10-CM

## 2019-10-07 DIAGNOSIS — N179 Acute kidney failure, unspecified: Secondary | ICD-10-CM

## 2019-10-07 DIAGNOSIS — N3000 Acute cystitis without hematuria: Secondary | ICD-10-CM

## 2019-10-07 LAB — CBC
HCT: 31.2 % — ABNORMAL LOW (ref 36.0–46.0)
Hemoglobin: 9.9 g/dL — ABNORMAL LOW (ref 12.0–15.0)
MCH: 31 pg (ref 26.0–34.0)
MCHC: 31.7 g/dL (ref 30.0–36.0)
MCV: 97.8 fL (ref 80.0–100.0)
Platelets: 191 10*3/uL (ref 150–400)
RBC: 3.19 MIL/uL — ABNORMAL LOW (ref 3.87–5.11)
RDW: 13.8 % (ref 11.5–15.5)
WBC: 10.9 10*3/uL — ABNORMAL HIGH (ref 4.0–10.5)
nRBC: 0 % (ref 0.0–0.2)

## 2019-10-07 LAB — BASIC METABOLIC PANEL
Anion gap: 10 (ref 5–15)
BUN: 27 mg/dL — ABNORMAL HIGH (ref 8–23)
CO2: 18 mmol/L — ABNORMAL LOW (ref 22–32)
Calcium: 8.3 mg/dL — ABNORMAL LOW (ref 8.9–10.3)
Chloride: 115 mmol/L — ABNORMAL HIGH (ref 98–111)
Creatinine, Ser: 1.14 mg/dL — ABNORMAL HIGH (ref 0.44–1.00)
GFR calc Af Amer: 50 mL/min — ABNORMAL LOW (ref >60)
GFR calc non Af Amer: 44 mL/min — ABNORMAL LOW (ref >60)
Glucose, Bld: 119 mg/dL — ABNORMAL HIGH (ref 70–99)
Potassium: 3.9 mmol/L (ref 3.5–5.1)
Sodium: 143 mmol/L (ref 135–145)

## 2019-10-07 MED ORDER — ENSURE ENLIVE PO LIQD
237.0000 mL | Freq: Two times a day (BID) | ORAL | Status: DC
  Administered 2019-10-08: 237 mL via ORAL

## 2019-10-07 NOTE — Progress Notes (Addendum)
Progress Note    Heather Singleton  JME:[id number] DOB: Jan 16, 1931  DOA: 10/04/2019 PCP: Nolene Ebbs, MD    Brief Narrative:     Medical records reviewed and are as summarized below:  Heather Singleton is an 86 y.o. female with hx HTN presented 7/17 with weakness and diarrhea.  She is cared for at home by her husband who is poor historian. She has significant sacral decub ulcers.  Reportedly wasn't feeling well x1 week, called PCP who gave abx for UTI but she continued to deteriorate at home. On arrival to ER was hypotensive with SBP 70's, afib with RVR 150's, u/a dirty, lactate 3.4, Scr 2, WBC 17.8, somnolent mental status.  BP, HR and mental status improved somewhat with volume but BP remains borderline so PCCM asked to admit to ICU. Tx out to [facility] on 7/20.  Poor nutritional status.  Concern for ability to heal wound.  She is from home with husband.  Palliative care is in talks with him regarding GOC and d/c plan.  Assessment/Plan:   Active Problems:   Septic shock ([place])   Palliative care by specialist   Goals of care, counseling/discussion   DNR (do not resuscitate)   Sepsis 2/2  E. Coli UTI; shock resolved -no escalation of care per family; palliative care following -deescalate abx to ceftriaxone  Unstageable pressure wounds to the coccyx, left and right ischial tuberosities -pictures in note from general surgery -recommendations: Would not recommend any surgical debridement at this time as she likely would not heal these areas. Continue santyl to necrotic areas and add xeroform/petroleum gauze to help soften the necrotic tissue and to protect the macerated areas. If patient improves and leaves the hospital she could benefit from referral to the outpatient wound clinic.  Anemia of chronic disease -hgb stable from prior  Chronic afib -resume PTA metoprolol -BP improved so will stop midodrine  AKI- improved -con't to monitor renal function -avoid nephrotoxic meds  Acute  encephalopathy, unclear if baseline dementia -con't supportive care  Severe protein calorie malnutrition -encourage PO intake -nutrition   Family Communication/Anticipated D/C date and plan/Code Status   DVT prophylaxis: Lovenox ordered. Code Status: dnr Disposition Plan: Status is: Inpatient  Remains inpatient appropriate because:IV treatments appropriate due to intensity of illness or inability to take PO   Dispo: The patient is from: Home              Anticipated d/c is to: TBD- needs hospice              Anticipated d/c date is: > 3 days              Patient currently is not medically stable to d/c.         Medical Consultants:    PCCM  GS    Subjective:   No complaints  Objective:    Vitals:   10/07/19 0000 10/07/19 0125 10/07/19 0128 10/07/19 0803  BP: (!) 111/57 113/77 114/82 131/74  Pulse: (!) 29 90 83 82  Resp: 16   20  Temp: 99.7 F (37.6 C) 97.8 F (36.6 C) (!) 97.5 F (36.4 C) 98.1 F (36.7 C)  TempSrc:  Oral Oral   SpO2: 100% 96% 100% 100%  Weight:      Height:        Intake/Output Summary (Last 24 hours) at 10/07/2019 1306 Last data filed at 10/07/2019 0000 Gross per 24 hour  Intake 1019.54 ml  Output 308 ml  Net  711.54 ml   Filed Weights   10/04/19 1949  Weight: 78.2 kg    Exam:  General: Appearance:     Overweight female in no acute distress     Lungs:     Clear to auscultation bilaterally, respirations unlabored  Heart:    Normal heart rate. Normal rhythm. No murmurs, rubs, or gallops.      Neurologic:   Awake, pleasant and cooperative    Data Reviewed:   I have personally reviewed following labs and imaging studies:  Labs: Labs show the following:   Basic Metabolic Panel: Recent Labs  Lab 10/04/19 1952 10/04/19 1952 10/04/19 2133 10/04/19 2133 10/05/19 0623 10/05/19 0623 10/06/19 0840 10/07/19 0945  NA 139  --  143  --  141  --  144 143  K 4.8   < > 4.0   < > 4.0   < > 3.6 3.9  CL 105  --   --   --   113*  --  118* 115*  CO2 21*  --   --   --  19*  --  19* 18*  GLUCOSE 109*  --   --   --  97  --  76 119*  BUN 72*  --   --   --  52*  --  32* 27*  CREATININE 2.00*  --   --   --  1.43*  --  1.15* 1.14*  CALCIUM 9.0  --   --   --  8.0*  --  7.7* 8.3*  MG  --   --   --   --  1.7  --   --   --   PHOS  --   --   --   --  2.7  --   --   --    < > = values in this interval not displayed.   GFR Estimated Creatinine Clearance: 36.6 mL/min (A) (by C-G formula based on SCr of 1.14 mg/dL (H)). Liver Function Tests: Recent Labs  Lab 10/04/19 1952 10/05/19 0623  AST 97* 89*  ALT 29 27  ALKPHOS 63 58  BILITOT 1.3* 0.6  PROT 6.9 5.3*  ALBUMIN 2.8* 1.9*   No results for input(s): LIPASE, AMYLASE in the last 168 hours. No results for input(s): AMMONIA in the last 168 hours. Coagulation profile Recent Labs  Lab 10/04/19 1952  INR 1.7*    CBC: Recent Labs  Lab 10/04/19 1952 10/04/19 2133 10/05/19 0623 10/07/19 0945  WBC 17.8*  --  19.9* 10.9*  NEUTROABS 15.5*  --   --   --   HGB 11.5* 8.5* 7.3* 9.9*  HCT 35.3* 25.0* 23.6* 31.2*  MCV 98.1  --  100.0 97.8  PLT 280  --  260 191   Cardiac Enzymes: No results for input(s): CKTOTAL, CKMB, CKMBINDEX, TROPONINI in the last 168 hours. BNP (last 3 results) No results for input(s): PROBNP in the last 8760 hours. CBG: Recent Labs  Lab 10/04/19 1913 10/05/19 0126 10/05/19 0223 10/06/19 0745  GLUCAP 113* 88 74 87   D-Dimer: No results for input(s): DDIMER in the last 72 hours. Hgb A1c: No results for input(s): HGBA1C in the last 72 hours. Lipid Profile: No results for input(s): CHOL, HDL, LDLCALC, TRIG, CHOLHDL, LDLDIRECT in the last 72 hours. Thyroid function studies: No results for input(s): TSH, T4TOTAL, T3FREE, THYROIDAB in the last 72 hours.  Invalid input(s): FREET3 Anemia work up: No results for input(s): VITAMINB12, FOLATE, FERRITIN, TIBC, IRON, RETICCTPCT  in the last 72 hours. Sepsis Labs: Recent Labs  Lab  10/04/19 1952 10/04/19 2022 10/04/19 2239 10/05/19 0123 10/05/19 0623 10/07/19 0945  WBC 17.8*  --   --   --  19.9* 10.9*  LATICACIDVEN  --  3.4* 3.4* 2.9* 1.7  --     Microbiology Recent Results (from the past 240 hour(s))  Urine culture     Status: Abnormal   Collection Time: 10/04/19  7:52 PM   Specimen: In/Out Cath Urine  Result Value Ref Range Status   Specimen Description IN/OUT CATH URINE  Final   Special Requests   Final    NONE Performed at [facility], [address] [place], [place] [address]    Culture >=100,000 COLONIES/mL ESCHERICHIA COLI (A)  Final   Report Status 10/06/2019 FINAL  Final   Organism ID, Bacteria ESCHERICHIA COLI (A)  Final      Susceptibility   Escherichia coli - MIC*    AMPICILLIN 4 SENSITIVE Sensitive     CEFAZOLIN <=4 SENSITIVE Sensitive     CEFTRIAXONE <=0.25 SENSITIVE Sensitive     CIPROFLOXACIN <=0.25 SENSITIVE Sensitive     GENTAMICIN <=1 SENSITIVE Sensitive     IMIPENEM <=0.25 SENSITIVE Sensitive     NITROFURANTOIN 128 RESISTANT Resistant     TRIMETH/SULFA <=20 SENSITIVE Sensitive     AMPICILLIN/SULBACTAM <=2 SENSITIVE Sensitive     PIP/TAZO <=4 SENSITIVE Sensitive     * >=100,000 COLONIES/mL ESCHERICHIA COLI  Blood Culture (routine x 2)     Status: None (Preliminary result)   Collection Time: 10/04/19  7:58 PM   Specimen: BLOOD  Result Value Ref Range Status   Specimen Description BLOOD SITE NOT SPECIFIED  Final   Special Requests   Final    BOTTLES DRAWN AEROBIC ONLY Blood Culture results may not be optimal due to an inadequate volume of blood received in culture bottles   Culture   Final    NO GROWTH 3 DAYS Performed at [facility], [place] [address] [place], [place] [address]    Report Status PENDING  Incomplete  SARS Coronavirus 2 by RT PCR (hospital order, performed in [facility] hospital lab) Nasopharyngeal Nasopharyngeal Swab     Status: None   Collection Time: 10/04/19  7:58 PM   Specimen: Nasopharyngeal  Swab  Result Value Ref Range Status   SARS Coronavirus 2 NEGATIVE NEGATIVE Final    Comment: (NOTE) SARS-CoV-2 target nucleic acids are NOT DETECTED.  The SARS-CoV-2 RNA is generally detectable in upper and lower respiratory specimens during the acute phase of infection. The lowest concentration of SARS-CoV-2 viral copies this assay can detect is 250 copies / mL. A negative result does not preclude SARS-CoV-2 infection and should not be used as the sole basis for treatment or other patient management decisions.  A negative result may occur with improper specimen collection / handling, submission of specimen other than nasopharyngeal swab, presence of viral mutation(s) within the areas targeted by this assay, and inadequate number of viral copies (<250 copies / mL). A negative result must be combined with clinical observations, patient history, and epidemiological information.  Fact Sheet for Patients:   StrictlyIdeas.no  Fact Sheet for Healthcare Providers: BankingDealers.co.za  This test is not yet approved or  cleared by the [place] FDA and has been authorized for detection and/or diagnosis of SARS-CoV-2 by FDA under an Emergency Use Authorization (EUA).  This EUA will remain in effect (meaning this test can be used) for the  duration of the COVID-19 declaration under Section 564(b)(1) of the Act, 21 U.S.C. section 360bbb-3(b)(1), unless the authorization is terminated or revoked sooner.  Performed at [facility], [place] [address], [place], [place] [address]   Blood Culture (routine x 2)     Status: None (Preliminary result)   Collection Time: 10/04/19  8:00 PM   Specimen: BLOOD RIGHT ARM  Result Value Ref Range Status   Specimen Description BLOOD RIGHT ARM  Final   Special Requests   Final    BOTTLES DRAWN AEROBIC AND ANAEROBIC Blood Culture results may not be optimal due to an inadequate volume of blood received in  culture bottles   Culture   Final    NO GROWTH 3 DAYS Performed at [facility], [place] [address] [place], [place] [address]    Report Status PENDING  Incomplete  Gastrointestinal Panel by PCR , Stool     Status: None   Collection Time: 10/04/19  9:03 PM   Specimen: STOOL  Result Value Ref Range Status   Campylobacter species NOT DETECTED NOT DETECTED Final   Plesimonas shigelloides NOT DETECTED NOT DETECTED Final   Salmonella species NOT DETECTED NOT DETECTED Final   Yersinia enterocolitica NOT DETECTED NOT DETECTED Final   Vibrio species NOT DETECTED NOT DETECTED Final   Vibrio cholerae NOT DETECTED NOT DETECTED Final   Enteroaggregative E coli (EAEC) NOT DETECTED NOT DETECTED Final   Enteropathogenic E coli (EPEC) NOT DETECTED NOT DETECTED Final   Enterotoxigenic E coli (ETEC) NOT DETECTED NOT DETECTED Final   Shiga like toxin producing E coli (STEC) NOT DETECTED NOT DETECTED Final   Shigella/Enteroinvasive E coli (EIEC) NOT DETECTED NOT DETECTED Final   Cryptosporidium NOT DETECTED NOT DETECTED Final   Cyclospora cayetanensis NOT DETECTED NOT DETECTED Final   Entamoeba histolytica NOT DETECTED NOT DETECTED Final   Giardia lamblia NOT DETECTED NOT DETECTED Final   Adenovirus F40/41 NOT DETECTED NOT DETECTED Final   Astrovirus NOT DETECTED NOT DETECTED Final   Norovirus GI/GII NOT DETECTED NOT DETECTED Final   Rotavirus A NOT DETECTED NOT DETECTED Final   Sapovirus (I, II, IV, and V) NOT DETECTED NOT DETECTED Final    Comment: Performed at [facility], [place] [place], [place] [address]  C Difficile Quick Screen w PCR reflex     Status: None   Collection Time: 10/04/19  9:03 PM   Specimen: STOOL  Result Value Ref Range Status   C Diff antigen NEGATIVE NEGATIVE Final   C Diff toxin NEGATIVE NEGATIVE Final   C Diff interpretation No C. difficile detected.  Final    Comment: Performed at [facility], [place] [address], [place], [place] [address]  MRSA  PCR Screening     Status: None   Collection Time: 10/05/19  2:04 AM   Specimen: Nasal Mucosa; Nasopharyngeal  Result Value Ref Range Status   MRSA by PCR NEGATIVE NEGATIVE Final    Comment:        The GeneXpert MRSA Assay (FDA approved for NASAL specimens only), is one component of a comprehensive MRSA colonization surveillance program. It is not intended to diagnose MRSA infection nor to guide or monitor treatment for MRSA infections. Performed at [facility], [place] [address] [place], [place] [address]     Procedures and diagnostic studies:  No results found.  Medications:   . brimonidine  1 drop Both Eyes TID  . chlorhexidine gluconate (MEDLINE KIT)  15 mL  Mouth Rinse BID  . Chlorhexidine Gluconate Cloth  6 each Topical Daily  . collagenase   Topical Daily  . dorzolamide-timolol  1 drop Both Eyes BID  . enoxaparin (LOVENOX) injection  40 mg Subcutaneous Daily  . latanoprost  1 drop Both Eyes QHS  . mouth rinse  15 mL Mouth Rinse 10 times per day  . metoprolol tartrate  50 mg Oral BID  . midodrine  5 mg Oral TID WC  . simvastatin  10 mg Oral QPM   Continuous Infusions: . sodium chloride 75 mL/hr at 10/07/19 1007  . sodium chloride Stopped (10/05/19 0154)  . cefTRIAXone (ROCEPHIN)  IV Stopped (10/06/19 2140)     LOS: 2 days   Geradine Girt  [facility]   How to contact the [facility] Attending or Consulting provider [place] or covering provider during after hours [place], for this patient?  1. Check the care team in [facility] and look for a) attending/consulting [facility] provider listed and b) the [facility] team listed 2. Log into [email or web address] and use [place]'s universal password to access. If you do not have the password, please contact the hospital operator. 3. Locate the [facility] provider you are looking for under [facility] and page to a number that you can be directly reached. 4. If you still have difficulty reaching the provider, please page the [facility] (Director  on Call) for the Hospitalists listed on amion for assistance.  10/07/2019, 1:06 PM

## 2019-10-07 NOTE — Progress Notes (Signed)
Daily Progress Note   Patient Name: Heather Singleton       Date: 10/07/2019 DOB: 11-May-1930  Age: 86 y.o. MRN#: [id number] Attending Physician: Geradine Girt, DO Primary Care Physician: Nolene Ebbs, MD Admit Date: 10/04/2019  Reason for Consultation/Follow-up: Establishing goals of care  Subjective: Patient wakes to voice. She is oriented to name and hospital, otherwise disoriented with pleasant confusion. She denies pain or discomfort.   Per RN, she is tolerating pills and took bites of breakfast and lunch without difficulty.   GOC:  PMT provider f/u following conversation with Tacey Ruiz on 10/05/19. Documentation reviewed and discussed with Dr. Eliseo Squires.   No family at bedside. Spoke with patient's husband, Heather Singleton via telephone.   Discussed course of hospitalization including diagnoses, interventions, plan of care. Discussed concern with poor ability for wound healing secondary to bedbound status, poor oral intake, and low albumin. Discussed recommendations from specialists. Prior to admission, patient living home with husband as primary caregiver. Expressed concern that Heather Singleton will need higher level of care--skilled nursing facility or even hospice services with concern that she will not tolerate aggressive rehab.   Heather Singleton is not ready to make decisions today. He feels she was cognitively improving yesterday during his visit and also she was eating for him. He is "praying she will walk up out of there" and feels we need to give her more time to "heal." He states "wait and see." Heather Singleton has PMT contact information and tells me he will be in touch. Reassured of ongoing follow-up this admission.   Length of Stay: 2  Current Medications: Scheduled Meds:  . brimonidine  1 drop Both  Eyes TID  . chlorhexidine gluconate (MEDLINE KIT)  15 mL Mouth Rinse BID  . Chlorhexidine Gluconate Cloth  6 each Topical Daily  . collagenase   Topical Daily  . dorzolamide-timolol  1 drop Both Eyes BID  . enoxaparin (LOVENOX) injection  40 mg Subcutaneous Daily  . latanoprost  1 drop Both Eyes QHS  . mouth rinse  15 mL Mouth Rinse 10 times per day  . metoprolol tartrate  50 mg Oral BID  . simvastatin  10 mg Oral QPM    Continuous Infusions: . sodium chloride 75 mL/hr at 10/07/19 1007  . sodium chloride Stopped (10/05/19 0154)  . cefTRIAXone (ROCEPHIN)  IV Stopped (10/06/19 2140)    PRN Meds: acetaminophen, diclofenac Sodium  Physical Exam Vitals and nursing note reviewed.  Constitutional:      Appearance: She is ill-appearing.  HENT:     Head: Normocephalic and atraumatic.  Pulmonary:     Effort: No tachypnea, accessory muscle usage or respiratory distress.  Skin:    General: Skin is warm and dry.  Neurological:     Mental Status: She is easily aroused.     Comments: Oriented to person/hospital, otherwise disoriented with pleasant confusion  Psychiatric:        Attention and Perception: She is inattentive.        Speech: Speech is delayed.        Cognition and Memory: Cognition is impaired.            Vital Signs: BP 131/74 (BP Location: Right Arm)   Pulse 82   Temp 98.1 F (36.7 C)   Resp 20   Ht '5\' 5"'$  (1.651 m)   Wt 78.2 kg   SpO2 100%   BMI 28.69 kg/m  SpO2: SpO2: 100 % O2 Device: O2 Device: Room Air O2 Flow Rate: O2 Flow Rate (L/min): 3 L/min  Intake/output summary:   Intake/Output Summary (Last 24 hours) at 10/07/2019 1501 Last data filed at 10/07/2019 1459 Gross per 24 hour  Intake 715 ml  Output 876 ml  Net -161 ml   LBM: Last BM Date: 10/05/19 Baseline Weight: Weight: 78.2 kg Most recent weight: Weight: 78.2 kg       Palliative Assessment/Data: PPS 30%      Patient Active Problem List   Diagnosis Date Noted  . Septic shock ([place])  10/05/2019  . Palliative care by specialist   . Goals of care, counseling/discussion   . DNR (do not resuscitate)     Palliative Care Assessment & Plan   Patient Profile: Per intake H&P --> Briefly, this is an 86 year old woman with history of HTN presenting with generalized weakness and diarrhea. She is found to have sacral decubitus ulcers. Recently was given outpatient antibiotics for UTI. PCCM consulted for sepsis, elevated lactic acid borderline blood pressure.  Palliative care was asked to get involved to help with [place] conversations.   Assessment: Sepsis, shock resolving E. Coli UTI Unstageable pressure wounds Anemia of chronic disease Chronic afib AKI improved Acute encephalopathy, likely dementia Severe protein calorie malnutrition  Recommendations/Plan:  DNR/DNI  Continue current plan of care and medical management.   Continue assist and encouragement with meals. Dysphagia diet.   PT/OT attempts. Concern that patient will not tolerate aggressive therapy due to baseline deconditioning and bedbound status.   Husband feels she has shown improvement in the past two days. He wishes to continue current plan of care and allow her more time for outcomes. He is not ready to make decisions regarding the need for higher level of care and/or hospice.   PMT will continue to follow.   Code Status: DNR   Code Status Orders  (From admission, onward)         Start     Ordered   10/05/19 1220  Do not attempt resuscitation (DNR)  Continuous       Question Answer Comment  In the event of cardiac or respiratory ARREST Do not call a "code blue"   In the event of cardiac or respiratory ARREST Do not perform Intubation, CPR, defibrillation or ACLS   In the event of cardiac or respiratory ARREST Use medication by any route,  position, wound care, and other measures to relive pain and suffering. May use oxygen, suction and manual treatment of airway obstruction as needed for  comfort.      10/05/19 1220        Code Status History    Date Active Date Inactive Code Status Order ID Comments User Context   10/05/2019 0027 10/05/2019 1220 Full Code [id number]  Marijean Heath, NP ED   Advance Care Planning Activity       Prognosis:  Poor long-term   Discharge Planning:  To Be Determined  Care plan was discussed with RN, Dr. Eliseo Squires, husband  Thank you for allowing the Palliative Medicine Team to assist in the care of this patient.   Time In: 0910- 1586- Time Out: 0930 1500 Total Time 35 Prolonged Time Billed no      Greater than 50%  of this time was spent counseling and coordinating care related to the above assessment and plan.  Ihor Dow, DNP, FNP-C Palliative Medicine Team  Phone: [phone] Fax: [phone]  Please contact Palliative Medicine Team phone at [phone] for questions and concerns.

## 2019-10-08 MED ORDER — RIVAROXABAN 20 MG PO TABS: 20.0000 mg | ORAL_TABLET | Freq: Every day | ORAL | Status: DC

## 2019-10-08 MED ORDER — KETOROLAC TROMETHAMINE 15 MG/ML IJ SOLN
15.0000 mg | Freq: Three times a day (TID) | INTRAMUSCULAR | Status: AC | PRN
  Administered 2019-10-08 – 2019-10-09 (×2): 15 mg via INTRAVENOUS
  Filled 2019-10-08 (×3): qty 1

## 2019-10-08 MED ORDER — ADULT MULTIVITAMIN W/MINERALS CH
1.0000 | ORAL_TABLET | Freq: Every day | ORAL | Status: DC
  Administered 2019-10-08 – 2019-10-13 (×6): 1 via ORAL
  Filled 2019-10-08 (×6): qty 1

## 2019-10-08 MED ORDER — RIVAROXABAN 15 MG PO TABS
15.0000 mg | ORAL_TABLET | Freq: Every day | ORAL | Status: DC
  Administered 2019-10-08 – 2019-10-12 (×5): 15 mg via ORAL
  Filled 2019-10-08 (×5): qty 1

## 2019-10-08 MED ORDER — ENSURE ENLIVE PO LIQD
237.0000 mL | Freq: Three times a day (TID) | ORAL | Status: DC
  Administered 2019-10-08 – 2019-10-13 (×13): 237 mL via ORAL

## 2019-10-08 MED ORDER — PROSOURCE PLUS PO LIQD
30.0000 mL | Freq: Two times a day (BID) | ORAL | Status: DC
  Administered 2019-10-08 – 2019-10-13 (×10): 30 mL via ORAL
  Filled 2019-10-08 (×11): qty 30

## 2019-10-08 NOTE — Progress Notes (Signed)
PROGRESS NOTE    Heather Singleton  JIR:[id number] DOB: [birth year] DOA: 10/04/2019 PCP: Nolene Ebbs, MD   Brief Narrative:   Heather Singleton is an 86 y.o. female with hx HTN presented 7/17 with weakness and diarrhea. She is cared for at home by her husband who is poor historian. She has significant sacral decub ulcers. Reportedly wasn't feeling well x1 week, called PCP who gave abx for UTI but she continued to deteriorate at home. On arrival to ER was hypotensive with SBP 70's, afib with RVR 150's, u/a dirty, lactate 3.4, Scr 2, WBC 17.8, somnolent mental status. BP, HR and mental status improved somewhat with volume but BP remains borderline so PCCM asked to admit to ICU. Tx out to [facility] on 7/20.  Poor nutritional status.  Concern for ability to heal wound.  She is from home with husband.  Palliative care is in talks with him regarding GOC and d/c plan.  Assessment & Plan:   Active Problems:   Septic shock ([place])   Palliative care by specialist   Goals of care, counseling/discussion   DNR (do not resuscitate)   Acute cystitis without hematuria   AKI (acute kidney injury) ([place])   Severe protein-calorie malnutrition ([place])   Sepsis 2/2 E. Coli UTI; shock resolved -no escalation of care per family; palliative care following -Continue ceftriaxone day 5/7 -No growth in blood cultures  Unstageable pressure wounds to the coccyx, left and right ischial tuberosities -pictures in note from general surgery -recommendations: Would not recommend any surgical debridement at this time as she likely would not heal these areas. Continue santyl to necrotic areas and add xeroform/petroleum gauze to help soften the necrotic tissue and to protect the macerated areas. If patient improves and leaves the hospital she could benefit from referral to the outpatient wound clinic.  Anemia of chronic disease -hgb stable from prior -Resume Xarelto  Chronic afib -resume PTA metoprolol -BP improved so will  stop midodrine -Resume home Xarelto  AKI- improved -con't to monitor renal function -avoid nephrotoxic meds  Acute encephalopathy, unclear if baseline dementia -con't supportive care  Severe protein calorie malnutrition -encourage PO intake -nutrition   DVT prophylaxis:Lovenox to Xarelto Code Status: DNR Family Communication: None at bedside Disposition Plan:   Status is: Inpatient  Remains inpatient appropriate because:Unsafe d/c plan and IV treatments appropriate due to intensity of illness or inability to take PO   Dispo: The patient is from: Home              Anticipated d/c is to: TBD-home hospice?              Anticipated d/c date is: 3 days              Patient currently is not medically stable to d/c.  Consultants:   PCCM  GS  Palliative  Procedures:   See above  Antimicrobials:  Anti-infectives (From admission, onward)   Start     Dose/Rate Route Frequency Ordered Stop   10/06/19 2200  cefTRIAXone (ROCEPHIN) 2 g in sodium chloride 0.9 % 100 mL IVPB     Discontinue     2 g 200 mL/hr over 30 Minutes Intravenous Every 24 hours 10/06/19 1347     10/06/19 1145  ceFEPIme (MAXIPIME) 2 g in sodium chloride 0.9 % 100 mL IVPB  Status:  Discontinued        2 g 200 mL/hr over 30 Minutes Intravenous Every 12 hours 10/06/19 1052 10/06/19 1347   10/05/19 2100  vancomycin (VANCOREADY) IVPB 750 mg/150 mL  Status:  Discontinued        750 mg 150 mL/hr over 60 Minutes Intravenous Every 24 hours 10/04/19 2134 10/05/19 1007   10/05/19 2030  ceFEPIme (MAXIPIME) 2 g in sodium chloride 0.9 % 100 mL IVPB  Status:  Discontinued        2 g 200 mL/hr over 30 Minutes Intravenous Every 24 hours 10/04/19 2134 10/06/19 1052   10/04/19 2000  vancomycin (VANCOREADY) IVPB 1500 mg/300 mL        1,500 mg 150 mL/hr over 120 Minutes Intravenous  Once 10/04/19 1950 10/04/19 2254   10/04/19 1930  ceFEPIme (MAXIPIME) 2 g in sodium chloride 0.9 % 100 mL IVPB        2 g 200 mL/hr over  30 Minutes Intravenous  Once 10/04/19 1927 10/04/19 2118   10/04/19 1930  metroNIDAZOLE (FLAGYL) IVPB 500 mg        500 mg 100 mL/hr over 60 Minutes Intravenous  Once 10/04/19 1927 10/04/19 2136   10/04/19 1930  vancomycin (VANCOCIN) IVPB 1000 mg/200 mL premix  Status:  Discontinued        1,000 mg 200 mL/hr over 60 Minutes Intravenous  Once 10/04/19 1927 10/04/19 1950       Subjective: Patient seen and evaluated today with no acute concerns or events noted overnight.  Objective: Vitals:   10/08/19 0814 10/08/19 1015 10/08/19 1305 10/08/19 1439  BP: (!) 127/101 113/83    Pulse: (!) 125 85 94   Resp: 20  (!) 38 (!) 24  Temp: 97.9 F (36.6 C)  97.8 F (36.6 C)   TempSrc:   Axillary   SpO2: 99%  100%   Weight:      Height:       No intake or output data in the 24 hours ending 10/08/19 1520 Filed Weights   10/04/19 1949  Weight: 78.2 kg    Examination:  General exam: Appears calm and comfortable  Respiratory system: Clear to auscultation. Respiratory effort normal. Cardiovascular system: S1 & S2 heard, RRR. No JVD, murmurs, rubs, gallops or clicks. No pedal edema. Gastrointestinal system: Abdomen is nondistended, soft and nontender. No organomegaly or masses felt. Normal bowel sounds heard. Central nervous system: Awake. Extremities: Unna boots with dressings c/d/i Psychiatry: Cannot be assessed.    Data Reviewed: I have personally reviewed following labs and imaging studies  CBC: Recent Labs  Lab 10/04/19 1952 10/04/19 2133 10/05/19 0623 10/07/19 0945  WBC 17.8*  --  19.9* 10.9*  NEUTROABS 15.5*  --   --   --   HGB 11.5* 8.5* 7.3* 9.9*  HCT 35.3* 25.0* 23.6* 31.2*  MCV 98.1  --  100.0 97.8  PLT 280  --  260 956   Basic Metabolic Panel: Recent Labs  Lab 10/04/19 1952 10/04/19 2133 10/05/19 0623 10/06/19 0840 10/07/19 0945  NA 139 143 141 144 143  K 4.8 4.0 4.0 3.6 3.9  CL 105  --  113* 118* 115*  CO2 21*  --  19* 19* 18*  GLUCOSE 109*  --  97 76  119*  BUN 72*  --  52* 32* 27*  CREATININE 2.00*  --  1.43* 1.15* 1.14*  CALCIUM 9.0  --  8.0* 7.7* 8.3*  MG  --   --  1.7  --   --   PHOS  --   --  2.7  --   --    GFR: Estimated Creatinine Clearance: 36.6 mL/min (A) (  by C-G formula based on SCr of 1.14 mg/dL (H)). Liver Function Tests: Recent Labs  Lab 10/04/19 1952 10/05/19 0623  AST 97* 89*  ALT 29 27  ALKPHOS 63 58  BILITOT 1.3* 0.6  PROT 6.9 5.3*  ALBUMIN 2.8* 1.9*   No results for input(s): LIPASE, AMYLASE in the last 168 hours. No results for input(s): AMMONIA in the last 168 hours. Coagulation Profile: Recent Labs  Lab 10/04/19 1952  INR 1.7*   Cardiac Enzymes: No results for input(s): CKTOTAL, CKMB, CKMBINDEX, TROPONINI in the last 168 hours. BNP (last 3 results) No results for input(s): PROBNP in the last 8760 hours. HbA1C: No results for input(s): HGBA1C in the last 72 hours. CBG: Recent Labs  Lab 10/04/19 1913 10/05/19 0126 10/05/19 0223 10/06/19 0745  GLUCAP 113* 88 74 87   Lipid Profile: No results for input(s): CHOL, HDL, LDLCALC, TRIG, CHOLHDL, LDLDIRECT in the last 72 hours. Thyroid Function Tests: No results for input(s): TSH, T4TOTAL, FREET4, T3FREE, THYROIDAB in the last 72 hours. Anemia Panel: No results for input(s): VITAMINB12, FOLATE, FERRITIN, TIBC, IRON, RETICCTPCT in the last 72 hours. Sepsis Labs: Recent Labs  Lab 10/04/19 2022 10/04/19 2239 10/05/19 0123 10/05/19 0623  LATICACIDVEN 3.4* 3.4* 2.9* 1.7    Recent Results (from the past 240 hour(s))  Urine culture     Status: Abnormal   Collection Time: 10/04/19  7:52 PM   Specimen: In/Out Cath Urine  Result Value Ref Range Status   Specimen Description IN/OUT CATH URINE  Final   Special Requests   Final    NONE Performed at [facility], [address], [place], [place] [address]    Culture >=100,000 COLONIES/mL ESCHERICHIA COLI (A)  Final   Report Status 10/06/2019 FINAL  Final   Organism ID, Bacteria  ESCHERICHIA COLI (A)  Final      Susceptibility   Escherichia coli - MIC*    AMPICILLIN 4 SENSITIVE Sensitive     CEFAZOLIN <=4 SENSITIVE Sensitive     CEFTRIAXONE <=0.25 SENSITIVE Sensitive     CIPROFLOXACIN <=0.25 SENSITIVE Sensitive     GENTAMICIN <=1 SENSITIVE Sensitive     IMIPENEM <=0.25 SENSITIVE Sensitive     NITROFURANTOIN 128 RESISTANT Resistant     TRIMETH/SULFA <=20 SENSITIVE Sensitive     AMPICILLIN/SULBACTAM <=2 SENSITIVE Sensitive     PIP/TAZO <=4 SENSITIVE Sensitive     * >=100,000 COLONIES/mL ESCHERICHIA COLI  Blood Culture (routine x 2)     Status: None (Preliminary result)   Collection Time: 10/04/19  7:58 PM   Specimen: BLOOD  Result Value Ref Range Status   Specimen Description BLOOD SITE NOT SPECIFIED  Final   Special Requests   Final    BOTTLES DRAWN AEROBIC ONLY Blood Culture results may not be optimal due to an inadequate volume of blood received in culture bottles   Culture   Final    NO GROWTH 4 DAYS Performed at [facility], [place] [address] [place], [place] [address]    Report Status PENDING  Incomplete  SARS Coronavirus 2 by RT PCR (hospital order, performed in [facility] hospital lab) Nasopharyngeal Nasopharyngeal Swab     Status: None   Collection Time: 10/04/19  7:58 PM   Specimen: Nasopharyngeal Swab  Result Value Ref Range Status   SARS Coronavirus 2 NEGATIVE NEGATIVE Final    Comment: (NOTE) SARS-CoV-2 target nucleic acids are NOT DETECTED.  The SARS-CoV-2 RNA is generally detectable in upper and lower respiratory specimens during  the acute phase of infection. The lowest concentration of SARS-CoV-2 viral copies this assay can detect is 250 copies / mL. A negative result does not preclude SARS-CoV-2 infection and should not be used as the sole basis for treatment or other patient management decisions.  A negative result may occur with improper specimen collection / handling, submission of specimen other than nasopharyngeal swab,  presence of viral mutation(s) within the areas targeted by this assay, and inadequate number of viral copies (<250 copies / mL). A negative result must be combined with clinical observations, patient history, and epidemiological information.  Fact Sheet for Patients:   StrictlyIdeas.no  Fact Sheet for Healthcare Providers: BankingDealers.co.za  This test is not yet approved or  cleared by the [place] FDA and has been authorized for detection and/or diagnosis of SARS-CoV-2 by FDA under an Emergency Use Authorization (EUA).  This EUA will remain in effect (meaning this test can be used) for the duration of the COVID-19 declaration under Section 564(b)(1) of the Act, 21 U.S.C. section 360bbb-3(b)(1), unless the authorization is terminated or revoked sooner.  Performed at [facility], [place] [address] [place], [place] [address]   Blood Culture (routine x 2)     Status: None (Preliminary result)   Collection Time: 10/04/19  8:00 PM   Specimen: BLOOD RIGHT ARM  Result Value Ref Range Status   Specimen Description BLOOD RIGHT ARM  Final   Special Requests   Final    BOTTLES DRAWN AEROBIC AND ANAEROBIC Blood Culture results may not be optimal due to an inadequate volume of blood received in culture bottles   Culture   Final    NO GROWTH 4 DAYS Performed at [facility], [place] [address], [place], [place] [address]    Report Status PENDING  Incomplete  Gastrointestinal Panel by PCR , Stool     Status: None   Collection Time: 10/04/19  9:03 PM   Specimen: STOOL  Result Value Ref Range Status   Campylobacter species NOT DETECTED NOT DETECTED Final   Plesimonas shigelloides NOT DETECTED NOT DETECTED Final   Salmonella species NOT DETECTED NOT DETECTED Final   Yersinia enterocolitica NOT DETECTED NOT DETECTED Final   Vibrio species NOT DETECTED NOT DETECTED Final   Vibrio cholerae NOT DETECTED NOT DETECTED Final    Enteroaggregative E coli (EAEC) NOT DETECTED NOT DETECTED Final   Enteropathogenic E coli (EPEC) NOT DETECTED NOT DETECTED Final   Enterotoxigenic E coli (ETEC) NOT DETECTED NOT DETECTED Final   Shiga like toxin producing E coli (STEC) NOT DETECTED NOT DETECTED Final   Shigella/Enteroinvasive E coli (EIEC) NOT DETECTED NOT DETECTED Final   Cryptosporidium NOT DETECTED NOT DETECTED Final   Cyclospora cayetanensis NOT DETECTED NOT DETECTED Final   Entamoeba histolytica NOT DETECTED NOT DETECTED Final   Giardia lamblia NOT DETECTED NOT DETECTED Final   Adenovirus F40/41 NOT DETECTED NOT DETECTED Final   Astrovirus NOT DETECTED NOT DETECTED Final   Norovirus GI/GII NOT DETECTED NOT DETECTED Final   Rotavirus A NOT DETECTED NOT DETECTED Final   Sapovirus (I, II, IV, and V) NOT DETECTED NOT DETECTED Final    Comment: Performed at [facility], [address] [place], [place] [address]  C Difficile Quick Screen w PCR reflex     Status: None   Collection Time: 10/04/19  9:03 PM   Specimen: STOOL  Result Value Ref Range Status   C Diff antigen NEGATIVE NEGATIVE Final   C Diff toxin NEGATIVE  NEGATIVE Final   C Diff interpretation No C. difficile detected.  Final    Comment: Performed at [facility], [address], [place], [place] [address]  MRSA PCR Screening     Status: None   Collection Time: 10/05/19  2:04 AM   Specimen: Nasal Mucosa; Nasopharyngeal  Result Value Ref Range Status   MRSA by PCR NEGATIVE NEGATIVE Final    Comment:        The GeneXpert MRSA Assay (FDA approved for NASAL specimens only), is one component of a comprehensive MRSA colonization surveillance program. It is not intended to diagnose MRSA infection nor to guide or monitor treatment for MRSA infections. Performed at [facility], [place] [address] [place], [place] [address]          Radiology Studies: DG CHEST PORT 1 VIEW  Result Date: 10/07/2019 CLINICAL DATA:  Sepsis,  hypertension, leukocytosis, altered mental status EXAM: PORTABLE CHEST 1 VIEW COMPARISON:  10/04/2019 FINDINGS: Since the prior examination, there has developed moderate interstitial pulmonary infiltrate within the perihilar and lower lung zones as well as small bilateral pleural effusions most in keeping with moderate cardiogenic failure. No pneumothorax. Mild cardiomegaly is stable. In no acute bone abnormality. IMPRESSION: Interval development of moderate cardiogenic failure. Small bilateral pleural effusions. Electronically Signed   By: Fidela Salisbury MD   On: 10/07/2019 20:01        Scheduled Meds: . (feeding supplement) PROSource Plus  30 mL Oral BID BM  . brimonidine  1 drop Both Eyes TID  . chlorhexidine gluconate (MEDLINE KIT)  15 mL Mouth Rinse BID  . Chlorhexidine Gluconate Cloth  6 each Topical Daily  . collagenase   Topical Daily  . dorzolamide-timolol  1 drop Both Eyes BID  . feeding supplement (ENSURE ENLIVE)  237 mL Oral TID BM  . latanoprost  1 drop Both Eyes QHS  . mouth rinse  15 mL Mouth Rinse 10 times per day  . metoprolol tartrate  50 mg Oral BID  . multivitamin with minerals  1 tablet Oral Daily  . rivaroxaban  15 mg Oral Q supper  . simvastatin  10 mg Oral QPM   Continuous Infusions: . sodium chloride 75 mL/hr at 10/08/19 1314  . sodium chloride Stopped (10/05/19 0154)  . cefTRIAXone (ROCEPHIN)  IV Stopped (10/08/19 0000)     LOS: 3 days    Time spent: 30 minutes    Heather Templeman Darleen Crocker, DO [facility]  If 7PM-7AM, please contact night-coverage [email or web address] 10/08/2019, 3:20 PM

## 2019-10-08 NOTE — Progress Notes (Signed)
Initial Nutrition Assessment  DOCUMENTATION CODES:   Not applicable  INTERVENTION:  Ensure Enlive po TID, each supplement provides 350 kcal and 20 grams of protein  MVI daily  9ml Prosource plus po BID, each supplement provides 150 kcal and 15 grams of protein  NUTRITION DIAGNOSIS:   Increased nutrient needs related to wound healing as evidenced by estimated needs.    GOAL:   Patient will meet greater than or equal to 90% of their needs    MONITOR:   PO intake, Supplement acceptance, Labs, Skin, I & O's  REASON FOR ASSESSMENT:   Malnutrition Screening Tool    ASSESSMENT:   Pt admitted with sepsis 2/2 E.coli UTI. PMH includes Afib, HTN, HLD, and GERD.  Pt with unstagable pressure wounds to the coccyx, L and R ischial tuberosities. Per MD, no indications for surgical debridement.   Palliative Care working with pt and husband.   Pt unavailable at time of RD visit.    Limited wt hx available for review.   PO Intake: 35-60% x 2 recorded meals Pt currently with orders for Ensure Enlive BID.  Labs and medications reviewed.    NUTRITION - FOCUSED PHYSICAL EXAM:  Unable to perform at this time, will attempt at follow-up.   Diet Order:   Diet Order            DIET - DYS 1 Room service appropriate? No; Fluid consistency: Thin  Diet effective now                 EDUCATION NEEDS:   No education needs have been identified at this time  Skin:  Skin Assessment: Skin Integrity Issues: Skin Integrity Issues:: Unstageable Unstageable: L buttocks, R buttocks x2  Last BM:  7/18  Height:   Ht Readings from Last 1 Encounters:  10/04/19 5\' 5"  (1.651 m)    Weight:   Wt Readings from Last 1 Encounters:  10/04/19 78.2 kg    BMI:  Body mass index is 28.69 kg/m.  Estimated Nutritional Needs:   Kcal:  1850-2050  Protein:  95-105 grams  Fluid:  >1.85L/d    10/06/19, MS, RD, LDN RD pager number and weekend/on-call pager number located in  Amion.

## 2019-10-08 NOTE — Progress Notes (Signed)
This chaplain responded to consult for spiritual care.  At the time of the visit, the Pt. was sleeping and did not respond to the call of her name.  The Pt. husband was not bedside. The chaplain will coordinate with the RN and PMT for an appropriate  visit time.

## 2019-10-09 LAB — BASIC METABOLIC PANEL
Anion gap: 9 (ref 5–15)
BUN: 39 mg/dL — ABNORMAL HIGH (ref 8–23)
CO2: 17 mmol/L — ABNORMAL LOW (ref 22–32)
Calcium: 8.4 mg/dL — ABNORMAL LOW (ref 8.9–10.3)
Chloride: 119 mmol/L — ABNORMAL HIGH (ref 98–111)
Creatinine, Ser: 1.45 mg/dL — ABNORMAL HIGH (ref 0.44–1.00)
GFR calc Af Amer: 37 mL/min — ABNORMAL LOW (ref >60)
GFR calc non Af Amer: 32 mL/min — ABNORMAL LOW (ref >60)
Glucose, Bld: 153 mg/dL — ABNORMAL HIGH (ref 70–99)
Potassium: 4.5 mmol/L (ref 3.5–5.1)
Sodium: 145 mmol/L (ref 135–145)

## 2019-10-09 LAB — CBC
HCT: 31 % — ABNORMAL LOW (ref 36.0–46.0)
Hemoglobin: 10 g/dL — ABNORMAL LOW (ref 12.0–15.0)
MCH: 31.1 pg (ref 26.0–34.0)
MCHC: 32.3 g/dL (ref 30.0–36.0)
MCV: 96.3 fL (ref 80.0–100.0)
Platelets: 308 10*3/uL (ref 150–400)
RBC: 3.22 MIL/uL — ABNORMAL LOW (ref 3.87–5.11)
RDW: 14.4 % (ref 11.5–15.5)
WBC: 9.9 10*3/uL (ref 4.0–10.5)
nRBC: 6 % — ABNORMAL HIGH (ref 0.0–0.2)

## 2019-10-09 LAB — CULTURE, BLOOD (ROUTINE X 2)
Culture: NO GROWTH
Culture: NO GROWTH

## 2019-10-09 LAB — MAGNESIUM: Magnesium: 1.9 mg/dL (ref 1.7–2.4)

## 2019-10-09 MED ORDER — SALINE SPRAY 0.65 % NA SOLN
1.0000 | NASAL | Status: DC | PRN
  Administered 2019-10-09: 1 via NASAL
  Filled 2019-10-09: qty 44

## 2019-10-09 MED ORDER — LEVALBUTEROL HCL 0.63 MG/3ML IN NEBU
0.6300 mg | INHALATION_SOLUTION | Freq: Four times a day (QID) | RESPIRATORY_TRACT | Status: DC | PRN
  Administered 2019-10-09: 0.63 mg via RESPIRATORY_TRACT
  Filled 2019-10-09: qty 3

## 2019-10-09 NOTE — Progress Notes (Signed)
PROGRESS NOTE    Heather Singleton  VOH:[id number] DOB: May 07, 1930 DOA: 10/04/2019 PCP: Nolene Ebbs, MD   Brief Narrative:   Heather Singleton an 86 y.o.femalewith hx HTN presented 7/17 with weakness and diarrhea. She is cared for at home by her husband who is poor historian. She has significant sacral decub ulcers. Reportedly wasn't feeling well x1 week, called PCP who gave abx for UTI but she continued to deteriorate at home. On arrival to ER was hypotensive with SBP 70's, afib with RVR 150's, u/a dirty, lactate 3.4, Scr 2, WBC 17.8, somnolent mental status. BP, HR and mental status improved somewhat with volume but BP remains borderline so PCCM asked to admit to ICU.Tx out to [facility] on 7/20. Poor nutritional status. Concern for ability to heal wound. She is from home with husband. Palliative care is in talks with him regarding GOC and d/c plan.  Assessment & Plan:   Active Problems:   Septic shock ([place])   Palliative care by specialist   Goals of care, counseling/discussion   DNR (do not resuscitate)   Acute cystitis without hematuria   AKI (acute kidney injury) ([place])   Severe protein-calorie malnutrition ([place])   Sepsis 2/2 E. Coli UTI; shock resolved -no escalation of care per family; palliative care following -Continue ceftriaxone day 6/7 -No growth in blood cultures -Likely requires home hospice on discharge  Unstageable pressure wounds to the coccyx, left and right ischial tuberosities -pictures in note from general surgery -recommendations:Would not recommend any surgical debridement at this time as she likely would not heal these areas. Continue santyl to necrotic areas and add xeroform/petroleum gauze to help soften the necrotic tissue and to protect the macerated areas. If patient improves and leaves the hospital she could benefit from referral to the outpatient wound clinic.  Anemia of chronic disease -hgb stable from prior -Resume Xarelto  Chronic  afib -resume PTA metoprolol -BP improved so will stop midodrine -Continue home Xarelto with no plans for surgical debridement -Mild labored breathing for which Xopenex and nasal sprays have been ordered.  AKI- improved -con't to monitor renal function -avoid nephrotoxic meds  Acute encephalopathy, unclear if baseline dementia -con't supportive care -Poor oral intake  Severe protein calorie malnutrition -encourage PO intake -nutrition   DVT prophylaxis:Xarelto Code Status: DNR Family Communication: None at bedside; tried calling husband on 7/21 with no response Disposition Plan:   Status is: Inpatient  Remains inpatient appropriate because:Unsafe d/c plan and IV treatments appropriate due to intensity of illness or inability to take PO   Dispo: The patient is from: Home  Anticipated d/c is to: TBD-home hospice?  Anticipated d/c date is: 2-3 days  Patient currently is not medically stable to d/c.  I believe she is approaching maximal medical benefit at this point.  Palliative plans for further discussion with husband regarding placement to home with hospice.  He appears to have some unrealistic expectations regarding her discharge.  Consultants:   PCCM  GS  Palliative  Procedures:   See above  Antimicrobials:  Anti-infectives (From admission, onward)   Start     Dose/Rate Route Frequency Ordered Stop   10/06/19 2200  cefTRIAXone (ROCEPHIN) 2 g in sodium chloride 0.9 % 100 mL IVPB     Discontinue     2 g 200 mL/hr over 30 Minutes Intravenous Every 24 hours 10/06/19 1347 10/10/19 2359   10/06/19 1145  ceFEPIme (MAXIPIME) 2 g in sodium chloride 0.9 % 100 mL IVPB  Status:  Discontinued  2 g 200 mL/hr over 30 Minutes Intravenous Every 12 hours 10/06/19 1052 10/06/19 1347   10/05/19 2100  vancomycin (VANCOREADY) IVPB 750 mg/150 mL  Status:  Discontinued        750 mg 150 mL/hr over 60 Minutes Intravenous Every 24  hours 10/04/19 2134 10/05/19 1007   10/05/19 2030  ceFEPIme (MAXIPIME) 2 g in sodium chloride 0.9 % 100 mL IVPB  Status:  Discontinued        2 g 200 mL/hr over 30 Minutes Intravenous Every 24 hours 10/04/19 2134 10/06/19 1052   10/04/19 2000  vancomycin (VANCOREADY) IVPB 1500 mg/300 mL        1,500 mg 150 mL/hr over 120 Minutes Intravenous  Once 10/04/19 1950 10/04/19 2254   10/04/19 1930  ceFEPIme (MAXIPIME) 2 g in sodium chloride 0.9 % 100 mL IVPB        2 g 200 mL/hr over 30 Minutes Intravenous  Once 10/04/19 1927 10/04/19 2118   10/04/19 1930  metroNIDAZOLE (FLAGYL) IVPB 500 mg        500 mg 100 mL/hr over 60 Minutes Intravenous  Once 10/04/19 1927 10/04/19 2136   10/04/19 1930  vancomycin (VANCOCIN) IVPB 1000 mg/200 mL premix  Status:  Discontinued        1,000 mg 200 mL/hr over 60 Minutes Intravenous  Once 10/04/19 1927 10/04/19 1950       Subjective: Patient seen and evaluated today with no acute overnight events noted.  She has overall poor oral intake.  Noted to have some mild labored breathing this morning.  Objective: Vitals:   10/08/19 1620 10/08/19 2030 10/09/19 0755 10/09/19 0857  BP: 91/60 122/74 (!) 157/106 (!) 147/99  Pulse: 76 82 (!) 103   Resp: 20 20 (!) 27   Temp: 98.4 F (36.9 C) 98.5 F (36.9 C) 98.2 F (36.8 C)   TempSrc: Axillary Oral    SpO2: 100% 100% 99%   Weight:      Height:        Intake/Output Summary (Last 24 hours) at 10/09/2019 1048 Last data filed at 10/09/2019 0500 Gross per 24 hour  Intake --  Output 525 ml  Net -525 ml   Filed Weights   10/04/19 1949  Weight: 78.2 kg    Examination:  General exam: Appears calm and comfortable  Respiratory system: Clear to auscultation. Respiratory effort normal. Cardiovascular system: S1 & S2 heard, RRR. No JVD, murmurs, rubs, gallops or clicks. No pedal edema. Gastrointestinal system: Abdomen is nondistended, soft and nontender. No organomegaly or masses felt. Normal bowel sounds  heard. Central nervous system: Awake Extremities: No edema Skin: Bilateral lower extremities with dressings clean dry and intact.  Unna boots present. Psychiatry: Cannot be assessed    Data Reviewed: I have personally reviewed following labs and imaging studies  CBC: Recent Labs  Lab 10/04/19 1952 10/04/19 2133 10/05/19 0623 10/07/19 0945 10/09/19 0308  WBC 17.8*  --  19.9* 10.9* 9.9  NEUTROABS 15.5*  --   --   --   --   HGB 11.5* 8.5* 7.3* 9.9* 10.0*  HCT 35.3* 25.0* 23.6* 31.2* 31.0*  MCV 98.1  --  100.0 97.8 96.3  PLT 280  --  260 191 169   Basic Metabolic Panel: Recent Labs  Lab 10/04/19 1952 10/04/19 1952 10/04/19 2133 10/05/19 0623 10/06/19 0840 10/07/19 0945 10/09/19 0308  NA 139   < > 143 141 144 143 145  K 4.8   < > 4.0 4.0 3.6 3.9 4.5  CL 105  --   --  113* 118* 115* 119*  CO2 21*  --   --  19* 19* 18* 17*  GLUCOSE 109*  --   --  97 76 119* 153*  BUN 72*  --   --  52* 32* 27* 39*  CREATININE 2.00*  --   --  1.43* 1.15* 1.14* 1.45*  CALCIUM 9.0  --   --  8.0* 7.7* 8.3* 8.4*  MG  --   --   --  1.7  --   --  1.9  PHOS  --   --   --  2.7  --   --   --    < > = values in this interval not displayed.   GFR: Estimated Creatinine Clearance: 28.3 mL/min (A) (by C-G formula based on SCr of 1.45 mg/dL (H)). Liver Function Tests: Recent Labs  Lab 10/04/19 1952 10/05/19 0623  AST 97* 89*  ALT 29 27  ALKPHOS 63 58  BILITOT 1.3* 0.6  PROT 6.9 5.3*  ALBUMIN 2.8* 1.9*   No results for input(s): LIPASE, AMYLASE in the last 168 hours. No results for input(s): AMMONIA in the last 168 hours. Coagulation Profile: Recent Labs  Lab 10/04/19 1952  INR 1.7*   Cardiac Enzymes: No results for input(s): CKTOTAL, CKMB, CKMBINDEX, TROPONINI in the last 168 hours. BNP (last 3 results) No results for input(s): PROBNP in the last 8760 hours. HbA1C: No results for input(s): HGBA1C in the last 72 hours. CBG: Recent Labs  Lab 10/04/19 1913 10/05/19 0126  10/05/19 0223 10/06/19 0745  GLUCAP 113* 88 74 87   Lipid Profile: No results for input(s): CHOL, HDL, LDLCALC, TRIG, CHOLHDL, LDLDIRECT in the last 72 hours. Thyroid Function Tests: No results for input(s): TSH, T4TOTAL, FREET4, T3FREE, THYROIDAB in the last 72 hours. Anemia Panel: No results for input(s): VITAMINB12, FOLATE, FERRITIN, TIBC, IRON, RETICCTPCT in the last 72 hours. Sepsis Labs: Recent Labs  Lab 10/04/19 2022 10/04/19 2239 10/05/19 0123 10/05/19 0623  LATICACIDVEN 3.4* 3.4* 2.9* 1.7    Recent Results (from the past 240 hour(s))  Urine culture     Status: Abnormal   Collection Time: 10/04/19  7:52 PM   Specimen: In/Out Cath Urine  Result Value Ref Range Status   Specimen Description IN/OUT CATH URINE  Final   Special Requests   Final    NONE Performed at [facility], [address], [place], [place] [address]    Culture >=100,000 COLONIES/mL ESCHERICHIA COLI (A)  Final   Report Status 10/06/2019 FINAL  Final   Organism ID, Bacteria ESCHERICHIA COLI (A)  Final      Susceptibility   Escherichia coli - MIC*    AMPICILLIN 4 SENSITIVE Sensitive     CEFAZOLIN <=4 SENSITIVE Sensitive     CEFTRIAXONE <=0.25 SENSITIVE Sensitive     CIPROFLOXACIN <=0.25 SENSITIVE Sensitive     GENTAMICIN <=1 SENSITIVE Sensitive     IMIPENEM <=0.25 SENSITIVE Sensitive     NITROFURANTOIN 128 RESISTANT Resistant     TRIMETH/SULFA <=20 SENSITIVE Sensitive     AMPICILLIN/SULBACTAM <=2 SENSITIVE Sensitive     PIP/TAZO <=4 SENSITIVE Sensitive     * >=100,000 COLONIES/mL ESCHERICHIA COLI  Blood Culture (routine x 2)     Status: None (Preliminary result)   Collection Time: 10/04/19  7:58 PM   Specimen: BLOOD  Result Value Ref Range Status   Specimen Description BLOOD SITE NOT SPECIFIED  Final   Special Requests  Final    BOTTLES DRAWN AEROBIC ONLY Blood Culture results may not be optimal due to an inadequate volume of blood received in culture bottles   Culture   Final    NO  GROWTH 4 DAYS Performed at [facility], [place] [address], [place], [place] [address]    Report Status PENDING  Incomplete  SARS Coronavirus 2 by RT PCR (hospital order, performed in [facility] hospital lab) Nasopharyngeal Nasopharyngeal Swab     Status: None   Collection Time: 10/04/19  7:58 PM   Specimen: Nasopharyngeal Swab  Result Value Ref Range Status   SARS Coronavirus 2 NEGATIVE NEGATIVE Final    Comment: (NOTE) SARS-CoV-2 target nucleic acids are NOT DETECTED.  The SARS-CoV-2 RNA is generally detectable in upper and lower respiratory specimens during the acute phase of infection. The lowest concentration of SARS-CoV-2 viral copies this assay can detect is 250 copies / mL. A negative result does not preclude SARS-CoV-2 infection and should not be used as the sole basis for treatment or other patient management decisions.  A negative result may occur with improper specimen collection / handling, submission of specimen other than nasopharyngeal swab, presence of viral mutation(s) within the areas targeted by this assay, and inadequate number of viral copies (<250 copies / mL). A negative result must be combined with clinical observations, patient history, and epidemiological information.  Fact Sheet for Patients:   StrictlyIdeas.no  Fact Sheet for Healthcare Providers: BankingDealers.co.za  This test is not yet approved or  cleared by the [place] FDA and has been authorized for detection and/or diagnosis of SARS-CoV-2 by FDA under an Emergency Use Authorization (EUA).  This EUA will remain in effect (meaning this test can be used) for the duration of the COVID-19 declaration under Section 564(b)(1) of the Act, 21 U.S.C. section 360bbb-3(b)(1), unless the authorization is terminated or revoked sooner.  Performed at [facility], [place] [address], [place], [place] [address]   Blood Culture (routine x 2)      Status: None (Preliminary result)   Collection Time: 10/04/19  8:00 PM   Specimen: BLOOD RIGHT ARM  Result Value Ref Range Status   Specimen Description BLOOD RIGHT ARM  Final   Special Requests   Final    BOTTLES DRAWN AEROBIC AND ANAEROBIC Blood Culture results may not be optimal due to an inadequate volume of blood received in culture bottles   Culture   Final    NO GROWTH 4 DAYS Performed at [facility], [place] [address], [place],  [address]    Report Status PENDING  Incomplete  Gastrointestinal Panel by PCR , Stool     Status: None   Collection Time: 10/04/19  9:03 PM   Specimen: STOOL  Result Value Ref Range Status   Campylobacter species NOT DETECTED NOT DETECTED Final   Plesimonas shigelloides NOT DETECTED NOT DETECTED Final   Salmonella species NOT DETECTED NOT DETECTED Final   Yersinia enterocolitica NOT DETECTED NOT DETECTED Final   Vibrio species NOT DETECTED NOT DETECTED Final   Vibrio cholerae NOT DETECTED NOT DETECTED Final   Enteroaggregative E coli (EAEC) NOT DETECTED NOT DETECTED Final   Enteropathogenic E coli (EPEC) NOT DETECTED NOT DETECTED Final   Enterotoxigenic E coli (ETEC) NOT DETECTED NOT DETECTED Final   Shiga like toxin producing E coli (STEC) NOT DETECTED NOT DETECTED Final   Shigella/Enteroinvasive E coli (EIEC) NOT DETECTED NOT DETECTED Final   Cryptosporidium NOT DETECTED NOT DETECTED Final  Cyclospora cayetanensis NOT DETECTED NOT DETECTED Final   Entamoeba histolytica NOT DETECTED NOT DETECTED Final   Giardia lamblia NOT DETECTED NOT DETECTED Final   Adenovirus F40/41 NOT DETECTED NOT DETECTED Final   Astrovirus NOT DETECTED NOT DETECTED Final   Norovirus GI/GII NOT DETECTED NOT DETECTED Final   Rotavirus A NOT DETECTED NOT DETECTED Final   Sapovirus (I, II, IV, and V) NOT DETECTED NOT DETECTED Final    Comment: Performed at [facility], [address], [place], [place] [address]  C Difficile Quick Screen w PCR reflex      Status: None   Collection Time: 10/04/19  9:03 PM   Specimen: STOOL  Result Value Ref Range Status   C Diff antigen NEGATIVE NEGATIVE Final   C Diff toxin NEGATIVE NEGATIVE Final   C Diff interpretation No C. difficile detected.  Final    Comment: Performed at [facility], [place] [address], [place], [place] [address]  MRSA PCR Screening     Status: None   Collection Time: 10/05/19  2:04 AM   Specimen: Nasal Mucosa; Nasopharyngeal  Result Value Ref Range Status   MRSA by PCR NEGATIVE NEGATIVE Final    Comment:        The GeneXpert MRSA Assay (FDA approved for NASAL specimens only), is one component of a comprehensive MRSA colonization surveillance program. It is not intended to diagnose MRSA infection nor to guide or monitor treatment for MRSA infections. Performed at [facility], [place] [address], [place], [place] [address]          Radiology Studies: DG CHEST PORT 1 VIEW  Result Date: 10/07/2019 CLINICAL DATA:  Sepsis, hypertension, leukocytosis, altered mental status EXAM: PORTABLE CHEST 1 VIEW COMPARISON:  10/04/2019 FINDINGS: Since the prior examination, there has developed moderate interstitial pulmonary infiltrate within the perihilar and lower lung zones as well as small bilateral pleural effusions most in keeping with moderate cardiogenic failure. No pneumothorax. Mild cardiomegaly is stable. In no acute bone abnormality. IMPRESSION: Interval development of moderate cardiogenic failure. Small bilateral pleural effusions. Electronically Signed   By: Fidela Salisbury MD   On: 10/07/2019 20:01        Scheduled Meds: . (feeding supplement) PROSource Plus  30 mL Oral BID BM  . brimonidine  1 drop Both Eyes TID  . chlorhexidine gluconate (MEDLINE KIT)  15 mL Mouth Rinse BID  . Chlorhexidine Gluconate Cloth  6 each Topical Daily  . collagenase   Topical Daily  . dorzolamide-timolol  1 drop Both Eyes BID  . feeding supplement (ENSURE ENLIVE)  237 mL Oral TID BM   . latanoprost  1 drop Both Eyes QHS  . mouth rinse  15 mL Mouth Rinse 10 times per day  . metoprolol tartrate  50 mg Oral BID  . multivitamin with minerals  1 tablet Oral Daily  . rivaroxaban  15 mg Oral Q supper  . simvastatin  10 mg Oral QPM   Continuous Infusions: . sodium chloride 75 mL/hr at 10/09/19 0334  . sodium chloride Stopped (10/05/19 0154)  . cefTRIAXone (ROCEPHIN)  IV 2 g (10/08/19 2255)     LOS: 4 days    Time spent: 30 minutes    Heavenleigh Petruzzi Darleen Crocker, DO [facility]  If 7PM-7AM, please contact night-coverage [email or web address] 10/09/2019, 10:48 AM

## 2019-10-09 NOTE — Progress Notes (Signed)
Daily Progress Note   Patient Name: Heather Singleton       Date: 10/09/2019 DOB: [birth year]  Age: 86 y.o. MRN#: [id number] Attending Physician: Rodena Goldmann, DO Primary Care Physician: Nolene Ebbs, MD Admit Date: 10/04/2019  Reason for Consultation/Follow-up: Establishing goals of care  Subjective: Patient oriented to name, otherwise disoriented. Pleasant confusion. No s/s of pain or discomfort. Does not engage in discussion.   GOC:  Husband at bedside. F/u GOC discussion.   Discussed in detail events leading up to admission and course of hospitalization including diagnoses, interventions, plan of care. Discussed concerns with poor wound healing due to bedbound status, poor nutritional status, and low albumin. Reviewed surgery recommendations. Discussed high risk for recurrent hospitalization and that she will not qualify for intense SNF rehab due to her current state and decline. Frankly and compassionately discussed poor prognosis and recommendation for hospice services.   Husband does not seem to understand severity of her condition and unlikelihood that she will improve. Explained stabilization with sepsis, more responsiveness, and accepting of bites and sips but prepared him for overall big picture of her condition.   Expressed concern with her returning home on discharge and will he be able to care for her at home in this state? Husband shares that he plans to speak with friends and family to receive additional support.   Husband is hoping to further discuss with Dr. Manuella Ghazi.   Secure epic chatted Dr. Manuella Ghazi to provide update on this conversation and request he further discuss diagnoses, poor prognosis, and hospice recommendation with husband. Dr. Manuella Ghazi plans to follow up with husband this  evening. Updated bedside RN.   Length of Stay: 4  Current Medications: Scheduled Meds:  . (feeding supplement) PROSource Plus  30 mL Oral BID BM  . brimonidine  1 drop Both Eyes TID  . chlorhexidine gluconate (MEDLINE KIT)  15 mL Mouth Rinse BID  . Chlorhexidine Gluconate Cloth  6 each Topical Daily  . collagenase   Topical Daily  . dorzolamide-timolol  1 drop Both Eyes BID  . feeding supplement (ENSURE ENLIVE)  237 mL Oral TID BM  . latanoprost  1 drop Both Eyes QHS  . mouth rinse  15 mL Mouth Rinse 10 times per day  . metoprolol tartrate  50 mg Oral BID  . multivitamin  with minerals  1 tablet Oral Daily  . rivaroxaban  15 mg Oral Q supper  . simvastatin  10 mg Oral QPM    Continuous Infusions: . sodium chloride 75 mL/hr at 10/09/19 0334  . sodium chloride Stopped (10/05/19 0154)  . cefTRIAXone (ROCEPHIN)  IV 2 g (10/08/19 2255)    PRN Meds: acetaminophen, diclofenac Sodium, ketorolac, levalbuterol, sodium chloride  Physical Exam Vitals and nursing note reviewed.  Constitutional:      Appearance: She is ill-appearing.  HENT:     Head: Normocephalic and atraumatic.  Pulmonary:     Effort: No tachypnea, accessory muscle usage or respiratory distress.  Skin:    General: Skin is warm and dry.  Neurological:     Mental Status: She is easily aroused.     Comments: Oriented to person/hospital, otherwise disoriented with pleasant confusion  Psychiatric:        Attention and Perception: She is inattentive.        Speech: Speech is delayed.        Cognition and Memory: Cognition is impaired.            Vital Signs: BP (!) 131/94   Pulse 96   Temp 98.2 F (36.8 C)   Resp 20   Ht '5\' 5"'$  (1.651 m)   Wt 78.2 kg   SpO2 93%   BMI 28.69 kg/m  SpO2: SpO2: 93 % O2 Device: O2 Device: Room Air O2 Flow Rate: O2 Flow Rate (L/min): 3 L/min  Intake/output summary:   Intake/Output Summary (Last 24 hours) at 10/09/2019 1613 Last data filed at 10/09/2019 0500 Gross per 24 hour    Intake --  Output 525 ml  Net -525 ml   LBM: Last BM Date: 10/09/19 Baseline Weight: Weight: 78.2 kg Most recent weight: Weight: 78.2 kg       Palliative Assessment/Data: PPS 30%    Flowsheet Rows     Most Recent Value  Intake Tab  Referral Department Critical care  Unit at Time of Referral ER  Palliative Care Primary Diagnosis Sepsis/Infectious Disease  Date Notified 10/05/19  Palliative Care Type New Palliative care  Reason for referral Clarify Goals of Care  Date of Admission 10/04/19  Date first seen by Palliative Care 10/05/19  # of days Palliative referral response time 0 Day(s)  # of days IP prior to Palliative referral 1  Clinical Assessment  Psychosocial & Spiritual Assessment  Palliative Care Outcomes      Patient Active Problem List   Diagnosis Date Noted  . Acute cystitis without hematuria   . AKI (acute kidney injury) ([place])   . Severe protein-calorie malnutrition ([place])   . Septic shock ([place]) 10/05/2019  . Palliative care by specialist   . Goals of care, counseling/discussion   . DNR (do not resuscitate)     Palliative Care Assessment & Plan   Patient Profile: Per intake H&P --> Briefly, this is an 86 year old woman with history of HTN presenting with generalized weakness and diarrhea. She is found to have sacral decubitus ulcers. Recently was given outpatient antibiotics for UTI. PCCM consulted for sepsis, elevated lactic acid borderline blood pressure.  Palliative care was asked to get involved to help with [place] conversations.   Assessment: Sepsis, shock resolving E. Coli UTI Unstageable pressure wounds Anemia of chronic disease Chronic afib AKI improved Acute encephalopathy, likely dementia Severe protein calorie malnutrition  Recommendations/Plan:  DNR/DNI  Continue current plan of care and medical management.   Continue assist and encouragement with  meals. Dysphagia diet.   PT/OT attempts. Concern that patient will not tolerate  aggressive therapy due to baseline deconditioning and bedbound status.   Husband does not seem to understand severity of her condition and poor long-term prognosis. Recommended home hospice services. Husband reaching out to family for more support in the home. Dr Manuella Ghazi to follow up with husband this afternoon and reiterate my discussion.    PMT will continue to follow.   Code Status: DNR   Code Status Orders  (From admission, onward)         Start     Ordered   10/05/19 1220  Do not attempt resuscitation (DNR)  Continuous       Question Answer Comment  In the event of cardiac or respiratory ARREST Do not call a "code blue"   In the event of cardiac or respiratory ARREST Do not perform Intubation, CPR, defibrillation or ACLS   In the event of cardiac or respiratory ARREST Use medication by any route, position, wound care, and other measures to relive pain and suffering. May use oxygen, suction and manual treatment of airway obstruction as needed for comfort.      10/05/19 1220        Code Status History    Date Active Date Inactive Code Status Order ID Comments User Context   10/05/2019 0027 10/05/2019 1220 Full Code [id number]  Marijean Heath, NP ED   Advance Care Planning Activity       Prognosis:  Poor long-term   Discharge Planning:  To Be Determined  Care plan was discussed with RN, Dr. Manuella Ghazi, husband  Thank you for allowing the Palliative Medicine Team to assist in the care of this patient.   Time In: 1550 Time Out: 1620 Total Time 30 Prolonged Time Billed no    Greater than 50% of this time was spent counseling and coordinating care related to the above assessment and plan.   Ihor Dow, DNP, FNP-C Palliative Medicine Team  Phone: [phone] Fax: [phone]  Please contact Palliative Medicine Team phone at [phone] for questions and concerns.

## 2019-10-10 LAB — BASIC METABOLIC PANEL
Anion gap: 9 (ref 5–15)
BUN: 38 mg/dL — ABNORMAL HIGH (ref 8–23)
CO2: 17 mmol/L — ABNORMAL LOW (ref 22–32)
Calcium: 8.9 mg/dL (ref 8.9–10.3)
Chloride: 120 mmol/L — ABNORMAL HIGH (ref 98–111)
Creatinine, Ser: 1.15 mg/dL — ABNORMAL HIGH (ref 0.44–1.00)
GFR calc Af Amer: 50 mL/min — ABNORMAL LOW (ref >60)
GFR calc non Af Amer: 43 mL/min — ABNORMAL LOW (ref >60)
Glucose, Bld: 118 mg/dL — ABNORMAL HIGH (ref 70–99)
Potassium: 5.2 mmol/L — ABNORMAL HIGH (ref 3.5–5.1)
Sodium: 146 mmol/L — ABNORMAL HIGH (ref 135–145)

## 2019-10-10 LAB — CBC
HCT: 34.1 % — ABNORMAL LOW (ref 36.0–46.0)
Hemoglobin: 10.7 g/dL — ABNORMAL LOW (ref 12.0–15.0)
MCH: 30.9 pg (ref 26.0–34.0)
MCHC: 31.4 g/dL (ref 30.0–36.0)
MCV: 98.6 fL (ref 80.0–100.0)
Platelets: 344 10*3/uL (ref 150–400)
RBC: 3.46 MIL/uL — ABNORMAL LOW (ref 3.87–5.11)
RDW: 14.8 % (ref 11.5–15.5)
WBC: 13.6 10*3/uL — ABNORMAL HIGH (ref 4.0–10.5)
nRBC: 14.5 % — ABNORMAL HIGH (ref 0.0–0.2)

## 2019-10-10 LAB — PATHOLOGIST SMEAR REVIEW

## 2019-10-10 MED ORDER — SODIUM ZIRCONIUM CYCLOSILICATE 10 G PO PACK
10.0000 g | PACK | Freq: Once | ORAL | Status: AC
  Administered 2019-10-10: 10 g via ORAL
  Filled 2019-10-10: qty 1

## 2019-10-10 NOTE — Progress Notes (Signed)
PROGRESS NOTE    Heather Singleton  EVO:[id number] DOB: [birth year] DOA: 10/04/2019 PCP: Nolene Ebbs, MD   Brief Narrative:   Heather Singleton an 86 y.o.femalewith hx HTN presented 7/17 with weakness and diarrhea. She is cared for at home by her husband who is poor historian. She has significant sacral decub ulcers. Reportedly wasn't feeling well x1 week, called PCP who gave abx for UTI but she continued to deteriorate at home. On arrival to ER was hypotensive with SBP 70's, afib with RVR 150's, u/a dirty, lactate 3.4, Scr 2, WBC 17.8, somnolent mental status. BP, HR and mental status improved somewhat with volume but BP remains borderline so PCCM asked to admit to ICU.Tx out to [facility] on 7/20. Poor nutritional status. Concern for ability to heal wound. She is from home with husband. Palliative care is in talks with him regarding GOC and d/c plan.  -Awaiting home hospice initiation.   Assessment & Plan:   Active Problems:   Septic shock ([place])   Palliative care by specialist   Goals of care, counseling/discussion   DNR (do not resuscitate)   Acute cystitis without hematuria   AKI (acute kidney injury) ([place])   Severe protein-calorie malnutrition ([place])   Sepsis 2/2 E. Coli UTI; shock resolved -no escalation of care per family; palliative care following -Continueceftriaxone day 7/7, dc tomorrow -No growth in blood cultures -Likely requires home hospice on discharge  Unstageable pressure wounds to the coccyx, left and right ischial tuberosities -pictures in note from general surgery -recommendations:Would not recommend any surgical debridement at this time as she likely would not heal these areas. Continue santyl to necrotic areas and add xeroform/petroleum gauze to help soften the necrotic tissue and to protect the macerated areas. If patient improves and leaves the hospital she could benefit from referral to the outpatient wound clinic.  Anemia of chronic disease -hgb  stable from prior -Resume Xarelto  Chronic afib -resume PTA metoprolol -BP improved so will stop midodrine -Continue home Xarelto with no plans for surgical debridement -Mild labored breathing for which Xopenex and nasal sprays have been ordered.  AKI- improved -con't to monitor renal function -avoid nephrotoxic meds  Acute encephalopathy, unclear if baseline dementia -con't supportive care -Poor oral intake  Severe protein calorie malnutrition -encourage PO intake -nutrition  Hyperkalemia -Lokelma given today -Recheck BMP in am   DVT prophylaxis:Xarelto Code Status:DNR Family Communication:Discussed with husband at bedside 7/22 Disposition Plan:  Status is: Inpatient  Remains inpatient appropriate because:Unsafe d/c plan and IV treatments appropriate due to intensity of illness or inability to take PO   Dispo: The patient is from:Home Anticipated d/c is EX:HBZJ with hospice Anticipated d/c date is: 1-2 days Patient can dc once home hospice arranged and equipment delivered. CSW and Palliative assisting in contacting husband.  Consultants:  PCCM  GS  Palliative  Procedures:  See above  Antimicrobials:  Anti-infectives (From admission, onward)   Start     Dose/Rate Route Frequency Ordered Stop   10/06/19 2200  cefTRIAXone (ROCEPHIN) 2 g in sodium chloride 0.9 % 100 mL IVPB     Discontinue     2 g 200 mL/hr over 30 Minutes Intravenous Every 24 hours 10/06/19 1347 10/10/19 2359   10/06/19 1145  ceFEPIme (MAXIPIME) 2 g in sodium chloride 0.9 % 100 mL IVPB  Status:  Discontinued        2 g 200 mL/hr over 30 Minutes Intravenous Every 12 hours 10/06/19 1052 10/06/19 1347   10/05/19 2100  vancomycin (VANCOREADY) IVPB 750 mg/150 mL  Status:  Discontinued        750 mg 150 mL/hr over 60 Minutes Intravenous Every 24 hours 10/04/19 2134 10/05/19 1007   10/05/19 2030  ceFEPIme (MAXIPIME) 2 g in sodium  chloride 0.9 % 100 mL IVPB  Status:  Discontinued        2 g 200 mL/hr over 30 Minutes Intravenous Every 24 hours 10/04/19 2134 10/06/19 1052   10/04/19 2000  vancomycin (VANCOREADY) IVPB 1500 mg/300 mL        1,500 mg 150 mL/hr over 120 Minutes Intravenous  Once 10/04/19 1950 10/04/19 2254   10/04/19 1930  ceFEPIme (MAXIPIME) 2 g in sodium chloride 0.9 % 100 mL IVPB        2 g 200 mL/hr over 30 Minutes Intravenous  Once 10/04/19 1927 10/04/19 2118   10/04/19 1930  metroNIDAZOLE (FLAGYL) IVPB 500 mg        500 mg 100 mL/hr over 60 Minutes Intravenous  Once 10/04/19 1927 10/04/19 2136   10/04/19 1930  vancomycin (VANCOCIN) IVPB 1000 mg/200 mL premix  Status:  Discontinued        1,000 mg 200 mL/hr over 60 Minutes Intravenous  Once 10/04/19 1927 10/04/19 1950       Subjective: Patient seen and evaluated today with no new acute complaints or concerns. No acute concerns or events noted overnight.  Objective: Vitals:   10/09/19 2115 10/10/19 0845 10/10/19 0845 10/10/19 1200  BP: (!) 159/92 (!) 140/94 (!) 140/94 (!) 134/106  Pulse: 76  (!) 132 105  Resp: 20  16   Temp: 98.3 F (36.8 C)  99.1 F (37.3 C) 98.1 F (36.7 C)  TempSrc: Oral   Oral  SpO2: 100%  100% 93%  Weight:      Height:       No intake or output data in the 24 hours ending 10/10/19 1532 Filed Weights   10/04/19 1949  Weight: 78.2 kg    Examination:  General exam: Appears calm and comfortable  Respiratory system: Clear to auscultation. Respiratory effort normal. Cardiovascular system: S1 & S2 heard, tachycardic. No JVD, murmurs, rubs, gallops or clicks. No pedal edema. Gastrointestinal system: Abdomen is nondistended, soft and nontender. No organomegaly or masses felt. Normal bowel sounds heard. Central nervous system: Awake Extremities: No edema Skin: Lower extremities covered in dressings c/d/i Psychiatry: Flat affect    Data Reviewed: I have personally reviewed following labs and imaging  studies  CBC: Recent Labs  Lab 10/04/19 1952 10/04/19 1952 10/04/19 2133 10/05/19 0623 10/07/19 0945 10/09/19 0308 10/10/19 0610  WBC 17.8*  --   --  19.9* 10.9* 9.9 13.6*  NEUTROABS 15.5*  --   --   --   --   --   --   HGB 11.5*   < > 8.5* 7.3* 9.9* 10.0* 10.7*  HCT 35.3*   < > 25.0* 23.6* 31.2* 31.0* 34.1*  MCV 98.1  --   --  100.0 97.8 96.3 98.6  PLT 280  --   --  260 191 308 344   < > = values in this interval not displayed.   Basic Metabolic Panel: Recent Labs  Lab 10/05/19 0623 10/06/19 0840 10/07/19 0945 10/09/19 0308 10/10/19 0610  NA 141 144 143 145 146*  K 4.0 3.6 3.9 4.5 5.2*  CL 113* 118* 115* 119* 120*  CO2 19* 19* 18* 17* 17*  GLUCOSE 97 76 119* 153* 118*  BUN 52* 32* 27* 39*  38*  CREATININE 1.43* 1.15* 1.14* 1.45* 1.15*  CALCIUM 8.0* 7.7* 8.3* 8.4* 8.9  MG 1.7  --   --  1.9  --   PHOS 2.7  --   --   --   --    GFR: Estimated Creatinine Clearance: 35.6 mL/min (A) (by C-G formula based on SCr of 1.15 mg/dL (H)). Liver Function Tests: Recent Labs  Lab 10/04/19 1952 10/05/19 0623  AST 97* 89*  ALT 29 27  ALKPHOS 63 58  BILITOT 1.3* 0.6  PROT 6.9 5.3*  ALBUMIN 2.8* 1.9*   No results for input(s): LIPASE, AMYLASE in the last 168 hours. No results for input(s): AMMONIA in the last 168 hours. Coagulation Profile: Recent Labs  Lab 10/04/19 1952  INR 1.7*   Cardiac Enzymes: No results for input(s): CKTOTAL, CKMB, CKMBINDEX, TROPONINI in the last 168 hours. BNP (last 3 results) No results for input(s): PROBNP in the last 8760 hours. HbA1C: No results for input(s): HGBA1C in the last 72 hours. CBG: Recent Labs  Lab 10/04/19 1913 10/05/19 0126 10/05/19 0223 10/06/19 0745  GLUCAP 113* 88 74 87   Lipid Profile: No results for input(s): CHOL, HDL, LDLCALC, TRIG, CHOLHDL, LDLDIRECT in the last 72 hours. Thyroid Function Tests: No results for input(s): TSH, T4TOTAL, FREET4, T3FREE, THYROIDAB in the last 72 hours. Anemia Panel: No results  for input(s): VITAMINB12, FOLATE, FERRITIN, TIBC, IRON, RETICCTPCT in the last 72 hours. Sepsis Labs: Recent Labs  Lab 10/04/19 2022 10/04/19 2239 10/05/19 0123 10/05/19 0623  LATICACIDVEN 3.4* 3.4* 2.9* 1.7    Recent Results (from the past 240 hour(s))  Urine culture     Status: Abnormal   Collection Time: 10/04/19  7:52 PM   Specimen: In/Out Cath Urine  Result Value Ref Range Status   Specimen Description IN/OUT CATH URINE  Final   Special Requests   Final    NONE Performed at [facility], [address], [place], [place] [address]    Culture >=100,000 COLONIES/mL ESCHERICHIA COLI (A)  Final   Report Status 10/06/2019 FINAL  Final   Organism ID, Bacteria ESCHERICHIA COLI (A)  Final      Susceptibility   Escherichia coli - MIC*    AMPICILLIN 4 SENSITIVE Sensitive     CEFAZOLIN <=4 SENSITIVE Sensitive     CEFTRIAXONE <=0.25 SENSITIVE Sensitive     CIPROFLOXACIN <=0.25 SENSITIVE Sensitive     GENTAMICIN <=1 SENSITIVE Sensitive     IMIPENEM <=0.25 SENSITIVE Sensitive     NITROFURANTOIN 128 RESISTANT Resistant     TRIMETH/SULFA <=20 SENSITIVE Sensitive     AMPICILLIN/SULBACTAM <=2 SENSITIVE Sensitive     PIP/TAZO <=4 SENSITIVE Sensitive     * >=100,000 COLONIES/mL ESCHERICHIA COLI  Blood Culture (routine x 2)     Status: None   Collection Time: 10/04/19  7:58 PM   Specimen: BLOOD  Result Value Ref Range Status   Specimen Description BLOOD SITE NOT SPECIFIED  Final   Special Requests   Final    BOTTLES DRAWN AEROBIC ONLY Blood Culture results may not be optimal due to an inadequate volume of blood received in culture bottles   Culture   Final    NO GROWTH 5 DAYS Performed at [facility], [place] [address], [place], [place] [address]    Report Status 10/09/2019 FINAL  Final  SARS Coronavirus 2 by RT PCR (hospital order, performed in [facility] hospital lab) Nasopharyngeal Nasopharyngeal Swab     Status: None  Collection Time: 10/04/19  7:58 PM   Specimen:  Nasopharyngeal Swab  Result Value Ref Range Status   SARS Coronavirus 2 NEGATIVE NEGATIVE Final    Comment: (NOTE) SARS-CoV-2 target nucleic acids are NOT DETECTED.  The SARS-CoV-2 RNA is generally detectable in upper and lower respiratory specimens during the acute phase of infection. The lowest concentration of SARS-CoV-2 viral copies this assay can detect is 250 copies / mL. A negative result does not preclude SARS-CoV-2 infection and should not be used as the sole basis for treatment or other patient management decisions.  A negative result may occur with improper specimen collection / handling, submission of specimen other than nasopharyngeal swab, presence of viral mutation(s) within the areas targeted by this assay, and inadequate number of viral copies (<250 copies / mL). A negative result must be combined with clinical observations, patient history, and epidemiological information.  Fact Sheet for Patients:   BoilerBrush.com.cy  Fact Sheet for Healthcare Providers: https://pope.com/  This test is not yet approved or  cleared by the [place] FDA and has been authorized for detection and/or diagnosis of SARS-CoV-2 by FDA under an Emergency Use Authorization (EUA).  This EUA will remain in effect (meaning this test can be used) for the duration of the COVID-19 declaration under Section 564(b)(1) of the Act, 21 U.S.C. section 360bbb-3(b)(1), unless the authorization is terminated or revoked sooner.  Performed at [facility], [address], [place], [place] [address]   Blood Culture (routine x 2)     Status: None   Collection Time: 10/04/19  8:00 PM   Specimen: BLOOD RIGHT ARM  Result Value Ref Range Status   Specimen Description BLOOD RIGHT ARM  Final   Special Requests   Final    BOTTLES DRAWN AEROBIC AND ANAEROBIC Blood Culture results may not be optimal due to an inadequate volume of blood received in culture  bottles   Culture   Final    NO GROWTH 5 DAYS Performed at [facility], [address], [place], [place] [address]    Report Status 10/09/2019 FINAL  Final  Gastrointestinal Panel by PCR , Stool     Status: None   Collection Time: 10/04/19  9:03 PM   Specimen: STOOL  Result Value Ref Range Status   Campylobacter species NOT DETECTED NOT DETECTED Final   Plesimonas shigelloides NOT DETECTED NOT DETECTED Final   Salmonella species NOT DETECTED NOT DETECTED Final   Yersinia enterocolitica NOT DETECTED NOT DETECTED Final   Vibrio species NOT DETECTED NOT DETECTED Final   Vibrio cholerae NOT DETECTED NOT DETECTED Final   Enteroaggregative E coli (EAEC) NOT DETECTED NOT DETECTED Final   Enteropathogenic E coli (EPEC) NOT DETECTED NOT DETECTED Final   Enterotoxigenic E coli (ETEC) NOT DETECTED NOT DETECTED Final   Shiga like toxin producing E coli (STEC) NOT DETECTED NOT DETECTED Final   Shigella/Enteroinvasive E coli (EIEC) NOT DETECTED NOT DETECTED Final   Cryptosporidium NOT DETECTED NOT DETECTED Final   Cyclospora cayetanensis NOT DETECTED NOT DETECTED Final   Entamoeba histolytica NOT DETECTED NOT DETECTED Final   Giardia lamblia NOT DETECTED NOT DETECTED Final   Adenovirus F40/41 NOT DETECTED NOT DETECTED Final   Astrovirus NOT DETECTED NOT DETECTED Final   Norovirus GI/GII NOT DETECTED NOT DETECTED Final   Rotavirus A NOT DETECTED NOT DETECTED Final   Sapovirus (I, II, IV, and V) NOT DETECTED NOT DETECTED Final    Comment: Performed at [facility], [address] [place], [place] [address]  C Difficile Quick Screen w PCR reflex     Status: None   Collection Time: 10/04/19  9:03 PM   Specimen: STOOL  Result Value Ref Range Status   C Diff antigen NEGATIVE NEGATIVE Final   C Diff toxin NEGATIVE NEGATIVE Final   C Diff interpretation No C. difficile detected.  Final    Comment: Performed at [facility], [place] [address] [place], [place] [address]  MRSA PCR  Screening     Status: None   Collection Time: 10/05/19  2:04 AM   Specimen: Nasal Mucosa; Nasopharyngeal  Result Value Ref Range Status   MRSA by PCR NEGATIVE NEGATIVE Final    Comment:        The GeneXpert MRSA Assay (FDA approved for NASAL specimens only), is one component of a comprehensive MRSA colonization surveillance program. It is not intended to diagnose MRSA infection nor to guide or monitor treatment for MRSA infections. Performed at [facility], [place] [address] [place], [place] [address]          Radiology Studies: No results found.      Scheduled Meds:  (feeding supplement) PROSource Plus  30 mL Oral BID BM   brimonidine  1 drop Both Eyes TID   chlorhexidine gluconate (MEDLINE KIT)  15 mL Mouth Rinse BID   Chlorhexidine Gluconate Cloth  6 each Topical Daily   collagenase   Topical Daily   dorzolamide-timolol  1 drop Both Eyes BID   feeding supplement (ENSURE ENLIVE)  237 mL Oral TID BM   latanoprost  1 drop Both Eyes QHS   mouth rinse  15 mL Mouth Rinse 10 times per day   metoprolol tartrate  50 mg Oral BID   multivitamin with minerals  1 tablet Oral Daily   rivaroxaban  15 mg Oral Q supper   simvastatin  10 mg Oral QPM   Continuous Infusions:  sodium chloride 75 mL/hr at 10/10/19 0147   sodium chloride Stopped (10/05/19 0154)   cefTRIAXone (ROCEPHIN)  IV 2 g (10/10/19 0015)     LOS: 5 days    Time spent: 30 minutes    Anyia Gierke Darleen Crocker, DO [facility]  If 7PM-7AM, please contact night-coverage [email or web address] 10/10/2019, 3:32 PM

## 2019-10-10 NOTE — Progress Notes (Signed)
Call placed to husband, Heather Singleton to f/u with him after his conversation with Dr. Sherryll Burger yesterday evening. No answer and vm box not set up. PMT provider will continue attempts to reach spouse today. Discussed with RN and requested she notify this NP if husband arrives to bedside. Thank you.   NO CHARGE  Vennie Homans, FNP-C Palliative Medicine Team  Phone: [phone] Fax: [phone]

## 2019-10-10 NOTE — TOC Progression Note (Signed)
Transition of Care [facility]) - Progression Note    Patient Details  Name: Heather Singleton MRN: [id number] Date of Birth: 1930/06/14  [facility] CM/SW Contact  Nonda Lou, [place] Phone Number: 10/10/2019, 2:40 PM  Clinical Narrative:    CSW contacted by [facility] and informed patient interested in home with hospice, per MD.  No family at bedside, CSW attempted to make contact with patient's spouse Heather Singleton to discuss hospice agencies. CSW unable to leave a voicemail, will continue to follow-up.         Expected Discharge Plan and Services                                                 Social Determinants of Health (SDOH) Interventions    Readmission Risk Interventions No flowsheet data found.

## 2019-10-11 LAB — BASIC METABOLIC PANEL
Anion gap: 11 (ref 5–15)
BUN: 34 mg/dL — ABNORMAL HIGH (ref 8–23)
CO2: 19 mmol/L — ABNORMAL LOW (ref 22–32)
Calcium: 8.8 mg/dL — ABNORMAL LOW (ref 8.9–10.3)
Chloride: 119 mmol/L — ABNORMAL HIGH (ref 98–111)
Creatinine, Ser: 1.21 mg/dL — ABNORMAL HIGH (ref 0.44–1.00)
GFR calc Af Amer: 47 mL/min — ABNORMAL LOW (ref >60)
GFR calc non Af Amer: 40 mL/min — ABNORMAL LOW (ref >60)
Glucose, Bld: 114 mg/dL — ABNORMAL HIGH (ref 70–99)
Potassium: 4.8 mmol/L (ref 3.5–5.1)
Sodium: 149 mmol/L — ABNORMAL HIGH (ref 135–145)

## 2019-10-11 NOTE — Progress Notes (Signed)
PROGRESS NOTE    Heather Singleton  QMG:[id number] DOB: 03/01/1931 DOA: 10/04/2019 PCP: Nolene Ebbs, MD   Brief Narrative:   Heather Singleton an 86 y.o.femalewith hx HTN presented 7/17 with weakness and diarrhea. She is cared for at home by her husband who is poor historian. She has significant sacral decub ulcers. Reportedly wasn't feeling well x1 week, called PCP who gave abx for UTI but she continued to deteriorate at home. On arrival to ER was hypotensive with SBP 70's, afib with RVR 150's, u/a dirty, lactate 3.4, Scr 2, WBC 17.8, somnolent mental status. BP, HR and mental status improved somewhat with volume but BP remains borderline so PCCM asked to admit to ICU.Tx out to [facility] on 7/20. Poor nutritional status. Concern for ability to heal wound. She is from home with husband. Palliative care is in talks with him regarding GOC and d/c plan.  -Awaiting home hospice initiation.   Assessment & Plan:   Active Problems:   Septic shock ([place])   Palliative care by specialist   Goals of care, counseling/discussion   DNR (do not resuscitate)   Acute cystitis without hematuria   AKI (acute kidney injury) ([place])   Severe protein-calorie malnutrition ([place])   Sepsis 2/2 E. Coli UTI; shock resolved -no escalation of care per family; palliative care following -Patient has completed 7-day course of Rocephin -No growth in blood cultures -Likely requires home hospice on discharge  Unstageable pressure wounds to the coccyx, left and right ischial tuberosities -pictures in note from general surgery -recommendations:Would not recommend any surgical debridement at this time as she likely would not heal these areas. Continue santyl to necrotic areas and add xeroform/petroleum gauze to help soften the necrotic tissue and to protect the macerated areas. If patient improves and leaves the hospital she could benefit from referral to the outpatient wound clinic.  Anemia of chronic  disease -hgb stable from prior -Resume Xarelto  Chronic afib -resume PTA metoprolol -BP improved so will stop midodrine -Continue home Xarelto with no plans for surgical debridement -Mild labored breathing forwhich Xopenex and nasal sprays have been ordered.  AKI- improved -con't to monitor renal function -avoid nephrotoxic meds  Acute encephalopathy, unclear if baseline dementia -con't supportive care -Poor oral intake  Severe protein calorie malnutrition -encourage PO intake -nutrition  Hyperkalemia-improved -Lokelma given 7/23 -Recheck BMP in am   DVT prophylaxis:Xarelto Code Status:DNR Family Communication:Discussed with husband at bedside 7/22 Disposition Plan:  Status is: Inpatient  Remains inpatient appropriate because:Unsafe d/c plan and IV treatments appropriate due to intensity of illness or inability to take PO   Dispo: The patient is from:Home Anticipated d/c is TO:[facility] with hospice Anticipated d/c date is:1-2 days Patient can dc once home hospice arranged and equipment delivered. CSW and Palliative assisting in contacting husband.  Consultants:  PCCM  GS  Palliative  Procedures:  See above  Antimicrobials:  Anti-infectives (From admission, onward)   Start     Dose/Rate Route Frequency Ordered Stop   10/06/19 2200  cefTRIAXone (ROCEPHIN) 2 g in sodium chloride 0.9 % 100 mL IVPB        2 g 200 mL/hr over 30 Minutes Intravenous Every 24 hours 10/06/19 1347 10/10/19 2351   10/06/19 1145  ceFEPIme (MAXIPIME) 2 g in sodium chloride 0.9 % 100 mL IVPB  Status:  Discontinued        2 g 200 mL/hr over 30 Minutes Intravenous Every 12 hours 10/06/19 1052 10/06/19 1347   10/05/19 2100  vancomycin (VANCOREADY)  IVPB 750 mg/150 mL  Status:  Discontinued        750 mg 150 mL/hr over 60 Minutes Intravenous Every 24 hours 10/04/19 2134 10/05/19 1007   10/05/19 2030  ceFEPIme (MAXIPIME) 2 g in  sodium chloride 0.9 % 100 mL IVPB  Status:  Discontinued        2 g 200 mL/hr over 30 Minutes Intravenous Every 24 hours 10/04/19 2134 10/06/19 1052   10/04/19 2000  vancomycin (VANCOREADY) IVPB 1500 mg/300 mL        1,500 mg 150 mL/hr over 120 Minutes Intravenous  Once 10/04/19 1950 10/04/19 2254   10/04/19 1930  ceFEPIme (MAXIPIME) 2 g in sodium chloride 0.9 % 100 mL IVPB        2 g 200 mL/hr over 30 Minutes Intravenous  Once 10/04/19 1927 10/04/19 2118   10/04/19 1930  metroNIDAZOLE (FLAGYL) IVPB 500 mg        500 mg 100 mL/hr over 60 Minutes Intravenous  Once 10/04/19 1927 10/04/19 2136   10/04/19 1930  vancomycin (VANCOCIN) IVPB 1000 mg/200 mL premix  Status:  Discontinued        1,000 mg 200 mL/hr over 60 Minutes Intravenous  Once 10/04/19 1927 10/04/19 1950       Subjective: Patient seen and evaluated today with no new acute complaints or concerns. No acute concerns or events noted overnight.  Objective: Vitals:   10/10/19 1703 10/10/19 1948 10/10/19 2133 10/11/19 0801  BP: (!) 147/90 (!) 140/101 (!) 140/95 (!) 134/102  Pulse: (!) 126 (!) 106 94 101  Resp: '17 18 20 18  '$ Temp:   97.9 F (36.6 C) 97.8 F (36.6 C)  TempSrc:   Oral   SpO2:  100% 99% 100%  Weight:      Height:        Intake/Output Summary (Last 24 hours) at 10/11/2019 1042 Last data filed at 10/11/2019 0630 Gross per 24 hour  Intake --  Output 550 ml  Net -550 ml   Filed Weights   10/04/19 1949  Weight: 78.2 kg    Examination:  General exam: Appears calm and comfortable  Respiratory system: Clear to auscultation. Respiratory effort normal. Cardiovascular system: S1 & S2 heard, RRR. No JVD, murmurs, rubs, gallops or clicks. No pedal edema. Gastrointestinal system: Abdomen is nondistended, soft and nontender. No organomegaly or masses felt. Normal bowel sounds heard. Central nervous system: Awake Extremities: Lower extremity dressings clean dry and intact Skin: No rashes, lesions or  ulcers Psychiatry: Cannot be assessed    Data Reviewed: I have personally reviewed following labs and imaging studies  CBC: Recent Labs  Lab 10/04/19 1952 10/04/19 1952 10/04/19 2133 10/05/19 0623 10/07/19 0945 10/09/19 0308 10/10/19 0610  WBC 17.8*  --   --  19.9* 10.9* 9.9 13.6*  NEUTROABS 15.5*  --   --   --   --   --   --   HGB 11.5*   < > 8.5* 7.3* 9.9* 10.0* 10.7*  HCT 35.3*   < > 25.0* 23.6* 31.2* 31.0* 34.1*  MCV 98.1  --   --  100.0 97.8 96.3 98.6  PLT 280  --   --  260 191 308 344   < > = values in this interval not displayed.   Basic Metabolic Panel: Recent Labs  Lab 10/05/19 0623 10/05/19 0623 10/06/19 0840 10/07/19 0945 10/09/19 0308 10/10/19 0610 10/11/19 0333  NA 141   < > 144 143 145 146* 149*  K 4.0   < >  3.6 3.9 4.5 5.2* 4.8  CL 113*   < > 118* 115* 119* 120* 119*  CO2 19*   < > 19* 18* 17* 17* 19*  GLUCOSE 97   < > 76 119* 153* 118* 114*  BUN 52*   < > 32* 27* 39* 38* 34*  CREATININE 1.43*   < > 1.15* 1.14* 1.45* 1.15* 1.21*  CALCIUM 8.0*   < > 7.7* 8.3* 8.4* 8.9 8.8*  MG 1.7  --   --   --  1.9  --   --   PHOS 2.7  --   --   --   --   --   --    < > = values in this interval not displayed.   GFR: Estimated Creatinine Clearance: 33.9 mL/min (A) (by C-G formula based on SCr of 1.21 mg/dL (H)). Liver Function Tests: Recent Labs  Lab 10/04/19 1952 10/05/19 0623  AST 97* 89*  ALT 29 27  ALKPHOS 63 58  BILITOT 1.3* 0.6  PROT 6.9 5.3*  ALBUMIN 2.8* 1.9*   No results for input(s): LIPASE, AMYLASE in the last 168 hours. No results for input(s): AMMONIA in the last 168 hours. Coagulation Profile: Recent Labs  Lab 10/04/19 1952  INR 1.7*   Cardiac Enzymes: No results for input(s): CKTOTAL, CKMB, CKMBINDEX, TROPONINI in the last 168 hours. BNP (last 3 results) No results for input(s): PROBNP in the last 8760 hours. HbA1C: No results for input(s): HGBA1C in the last 72 hours. CBG: Recent Labs  Lab 10/04/19 1913 10/05/19 0126  10/05/19 0223 10/06/19 0745  GLUCAP 113* 88 74 87   Lipid Profile: No results for input(s): CHOL, HDL, LDLCALC, TRIG, CHOLHDL, LDLDIRECT in the last 72 hours. Thyroid Function Tests: No results for input(s): TSH, T4TOTAL, FREET4, T3FREE, THYROIDAB in the last 72 hours. Anemia Panel: No results for input(s): VITAMINB12, FOLATE, FERRITIN, TIBC, IRON, RETICCTPCT in the last 72 hours. Sepsis Labs: Recent Labs  Lab 10/04/19 2022 10/04/19 2239 10/05/19 0123 10/05/19 0623  LATICACIDVEN 3.4* 3.4* 2.9* 1.7    Recent Results (from the past 240 hour(s))  Urine culture     Status: Abnormal   Collection Time: 10/04/19  7:52 PM   Specimen: In/Out Cath Urine  Result Value Ref Range Status   Specimen Description IN/OUT CATH URINE  Final   Special Requests   Final    NONE Performed at [facility], [address], [place], [place] [address]    Culture >=100,000 COLONIES/mL ESCHERICHIA COLI (A)  Final   Report Status 10/06/2019 FINAL  Final   Organism ID, Bacteria ESCHERICHIA COLI (A)  Final      Susceptibility   Escherichia coli - MIC*    AMPICILLIN 4 SENSITIVE Sensitive     CEFAZOLIN <=4 SENSITIVE Sensitive     CEFTRIAXONE <=0.25 SENSITIVE Sensitive     CIPROFLOXACIN <=0.25 SENSITIVE Sensitive     GENTAMICIN <=1 SENSITIVE Sensitive     IMIPENEM <=0.25 SENSITIVE Sensitive     NITROFURANTOIN 128 RESISTANT Resistant     TRIMETH/SULFA <=20 SENSITIVE Sensitive     AMPICILLIN/SULBACTAM <=2 SENSITIVE Sensitive     PIP/TAZO <=4 SENSITIVE Sensitive     * >=100,000 COLONIES/mL ESCHERICHIA COLI  Blood Culture (routine x 2)     Status: None   Collection Time: 10/04/19  7:58 PM   Specimen: BLOOD  Result Value Ref Range Status   Specimen Description BLOOD SITE NOT SPECIFIED  Final   Special Requests   Final  BOTTLES DRAWN AEROBIC ONLY Blood Culture results may not be optimal due to an inadequate volume of blood received in culture bottles   Culture   Final    NO GROWTH 5  DAYS Performed at [facility], [place] [address], [place], [place] [address]    Report Status 10/09/2019 FINAL  Final  SARS Coronavirus 2 by RT PCR (hospital order, performed in [facility] hospital lab) Nasopharyngeal Nasopharyngeal Swab     Status: None   Collection Time: 10/04/19  7:58 PM   Specimen: Nasopharyngeal Swab  Result Value Ref Range Status   SARS Coronavirus 2 NEGATIVE NEGATIVE Final    Comment: (NOTE) SARS-CoV-2 target nucleic acids are NOT DETECTED.  The SARS-CoV-2 RNA is generally detectable in upper and lower respiratory specimens during the acute phase of infection. The lowest concentration of SARS-CoV-2 viral copies this assay can detect is 250 copies / mL. A negative result does not preclude SARS-CoV-2 infection and should not be used as the sole basis for treatment or other patient management decisions.  A negative result may occur with improper specimen collection / handling, submission of specimen other than nasopharyngeal swab, presence of viral mutation(s) within the areas targeted by this assay, and inadequate number of viral copies (<250 copies / mL). A negative result must be combined with clinical observations, patient history, and epidemiological information.  Fact Sheet for Patients:   StrictlyIdeas.no  Fact Sheet for Healthcare Providers: BankingDealers.co.za  This test is not yet approved or  cleared by the [place] FDA and has been authorized for detection and/or diagnosis of SARS-CoV-2 by FDA under an Emergency Use Authorization (EUA).  This EUA will remain in effect (meaning this test can be used) for the duration of the COVID-19 declaration under Section 564(b)(1) of the Act, 21 U.S.C. section 360bbb-3(b)(1), unless the authorization is terminated or revoked sooner.  Performed at [facility], [place] [address], [place], [place] [address]   Blood Culture (routine x 2)     Status:  None   Collection Time: 10/04/19  8:00 PM   Specimen: BLOOD RIGHT ARM  Result Value Ref Range Status   Specimen Description BLOOD RIGHT ARM  Final   Special Requests   Final    BOTTLES DRAWN AEROBIC AND ANAEROBIC Blood Culture results may not be optimal due to an inadequate volume of blood received in culture bottles   Culture   Final    NO GROWTH 5 DAYS Performed at [facility], [place] [address], [place], [place] [address]    Report Status 10/09/2019 FINAL  Final  Gastrointestinal Panel by PCR , Stool     Status: None   Collection Time: 10/04/19  9:03 PM   Specimen: STOOL  Result Value Ref Range Status   Campylobacter species NOT DETECTED NOT DETECTED Final   Plesimonas shigelloides NOT DETECTED NOT DETECTED Final   Salmonella species NOT DETECTED NOT DETECTED Final   Yersinia enterocolitica NOT DETECTED NOT DETECTED Final   Vibrio species NOT DETECTED NOT DETECTED Final   Vibrio cholerae NOT DETECTED NOT DETECTED Final   Enteroaggregative E coli (EAEC) NOT DETECTED NOT DETECTED Final   Enteropathogenic E coli (EPEC) NOT DETECTED NOT DETECTED Final   Enterotoxigenic E coli (ETEC) NOT DETECTED NOT DETECTED Final   Shiga like toxin producing E coli (STEC) NOT DETECTED NOT DETECTED Final   Shigella/Enteroinvasive E coli (EIEC) NOT DETECTED NOT DETECTED Final   Cryptosporidium NOT DETECTED NOT DETECTED Final   Cyclospora cayetanensis  NOT DETECTED NOT DETECTED Final   Entamoeba histolytica NOT DETECTED NOT DETECTED Final   Giardia lamblia NOT DETECTED NOT DETECTED Final   Adenovirus F40/41 NOT DETECTED NOT DETECTED Final   Astrovirus NOT DETECTED NOT DETECTED Final   Norovirus GI/GII NOT DETECTED NOT DETECTED Final   Rotavirus A NOT DETECTED NOT DETECTED Final   Sapovirus (I, II, IV, and V) NOT DETECTED NOT DETECTED Final    Comment: Performed at [facility], [address], [place], [place] [address]  C Difficile Quick Screen w PCR reflex     Status: None    Collection Time: 10/04/19  9:03 PM   Specimen: STOOL  Result Value Ref Range Status   C Diff antigen NEGATIVE NEGATIVE Final   C Diff toxin NEGATIVE NEGATIVE Final   C Diff interpretation No C. difficile detected.  Final    Comment: Performed at [facility], [place] [address] [place], [place] [address]  MRSA PCR Screening     Status: None   Collection Time: 10/05/19  2:04 AM   Specimen: Nasal Mucosa; Nasopharyngeal  Result Value Ref Range Status   MRSA by PCR NEGATIVE NEGATIVE Final    Comment:        The GeneXpert MRSA Assay (FDA approved for NASAL specimens only), is one component of a comprehensive MRSA colonization surveillance program. It is not intended to diagnose MRSA infection nor to guide or monitor treatment for MRSA infections. Performed at [facility], [place] [address], [place], [place] [address]          Radiology Studies: No results found.      Scheduled Meds: . (feeding supplement) PROSource Plus  30 mL Oral BID BM  . brimonidine  1 drop Both Eyes TID  . chlorhexidine gluconate (MEDLINE KIT)  15 mL Mouth Rinse BID  . Chlorhexidine Gluconate Cloth  6 each Topical Daily  . collagenase   Topical Daily  . dorzolamide-timolol  1 drop Both Eyes BID  . feeding supplement (ENSURE ENLIVE)  237 mL Oral TID BM  . latanoprost  1 drop Both Eyes QHS  . mouth rinse  15 mL Mouth Rinse 10 times per day  . metoprolol tartrate  50 mg Oral BID  . multivitamin with minerals  1 tablet Oral Daily  . rivaroxaban  15 mg Oral Q supper  . simvastatin  10 mg Oral QPM   Continuous Infusions: . sodium chloride 75 mL/hr at 10/10/19 0147  . sodium chloride Stopped (10/05/19 0154)     LOS: 6 days    Time spent: 30 minutes    Tineka Uriegas Darleen Crocker, DO [facility]  If 7PM-7AM, please contact night-coverage [email or web address] 10/11/2019, 10:42 AM

## 2019-10-11 NOTE — Discharge Instructions (Signed)

## 2019-10-12 ENCOUNTER — Encounter (HOSPITAL_COMMUNITY): Payer: Self-pay | Admitting: Pulmonary Disease

## 2019-10-12 LAB — BASIC METABOLIC PANEL
Anion gap: 8 (ref 5–15)
BUN: 27 mg/dL — ABNORMAL HIGH (ref 8–23)
CO2: 20 mmol/L — ABNORMAL LOW (ref 22–32)
Calcium: 8.5 mg/dL — ABNORMAL LOW (ref 8.9–10.3)
Chloride: 119 mmol/L — ABNORMAL HIGH (ref 98–111)
Creatinine, Ser: 1.13 mg/dL — ABNORMAL HIGH (ref 0.44–1.00)
GFR calc Af Amer: 51 mL/min — ABNORMAL LOW (ref >60)
GFR calc non Af Amer: 44 mL/min — ABNORMAL LOW (ref >60)
Glucose, Bld: 110 mg/dL — ABNORMAL HIGH (ref 70–99)
Potassium: 4.2 mmol/L (ref 3.5–5.1)
Sodium: 147 mmol/L — ABNORMAL HIGH (ref 135–145)

## 2019-10-12 NOTE — Progress Notes (Signed)
Civil engineer, contracting Documentation  Liaison received referral for pt to dc to [facility] for EOL care.   Writer called pt's husband with no answer and VM was not set up.   Pt is eligible for [facility] but we currently have no bed availability.   Liaison will follow up with TOC and family tomorrow.   Thank you for the referral.   Trena Platt, RN  [facility] Liaison [phone]

## 2019-10-12 NOTE — Progress Notes (Signed)
PROGRESS NOTE    Heather Singleton  MBE:[id number] DOB: 1931-01-07 DOA: 10/04/2019 PCP: Nolene Ebbs, MD   Brief Narrative:  Heather Singleton an 86 y.o.femalewith hx HTN presented 7/17 with weakness and diarrhea. She is cared for at home by her husband who is poor historian. She has significant sacral decub ulcers. Reportedly wasn't feeling well x1 week, called PCP who gave abx for UTI but she continued to deteriorate at home. On arrival to ER was hypotensive with SBP 70's, afib with RVR 150's, u/a dirty, lactate 3.4, Scr 2, WBC 17.8, somnolent mental status. BP, HR and mental status improved somewhat with volume but BP remains borderline so PCCM asked to admit to ICU.Tx out to [facility] on 7/20. Poor nutritional status. Concern for ability to heal wound. She is from home with husband. Palliative care is in talks with him regarding GOC and d/c plan.  -Awaiting home hospice initiation. Husband has been difficult to get in touch with.  She will need home equipment delivered prior to discharge.   Assessment & Plan:   Active Problems:   Septic shock ([place])   Palliative care by specialist   Goals of care, counseling/discussion   DNR (do not resuscitate)   Acute cystitis without hematuria   AKI (acute kidney injury) ([place])   Severe protein-calorie malnutrition ([place])   Sepsis 2/2 E. Coli UTI; shock resolved -no escalation of care per family; palliative care following -Patient has completed 7-day course of Rocephin -No growth in blood cultures -Plans for home with hospice on discharge, but husband has not been available for further discussion with palliative care and CSW.  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 Continue santyl to necrotic areas and add xeroform/petroleum gauze to help soften  the necrotic tissue and to protect the macerated areas. If patient improves and leaves the hospital she could benefit from referral to the outpatient wound clinic.  Anemia of chronic disease -hgb stable from prior -Continue Xarelto  Chronic afib -resume PTA metoprolol -BP improved so will stop midodrine -Continue home Xarelto with no plans for surgical debridement -Mild labored breathing forwhich Xopenex and nasal sprays have been ordered.  AKI-resolved -con't to monitor renal function as needed -avoid nephrotoxic meds  Acute encephalopathy, unclear if baseline dementia -con't supportive care -Poor oral intake  Severe protein calorie malnutrition -encourage PO intake -nutrition  Hyperkalemia-resolved   DVT prophylaxis:Xarelto Code Status:DNR Family Communication:Discussed with husband at bedside 7/22, has not been available for further discussions Disposition Plan:  Status is: Inpatient  Remains inpatient appropriate because:Unsafe d/c plan and IV treatments appropriate due to intensity of illness or inability to take PO   Dispo: The patient is from:Home Anticipated d/c is EO:FHQR with hospice Anticipated d/c date is:1-2days Patientcan dc once home hospice arranged and equipment delivered. CSW and Palliative assisting in contacting husband.  Consultants:  PCCM  GS  Palliative  Procedures:  See above  Antimicrobials:  Anti-infectives (From admission, onward)   Start     Dose/Rate Route Frequency Ordered Stop   10/06/19 2200  cefTRIAXone (ROCEPHIN) 2 g in sodium chloride 0.9 % 100 mL IVPB        2 g 200 mL/hr over 30 Minutes Intravenous Every 24 hours 10/06/19 1347 10/10/19 2351   10/06/19 1145  ceFEPIme (MAXIPIME) 2 g in sodium chloride 0.9 % 100 mL IVPB  Status:  Discontinued        2 g 200 mL/hr over 30 Minutes Intravenous Every 12 hours 10/06/19 1052 10/06/19 1347   10/05/19 2100   vancomycin (VANCOREADY) IVPB 750 mg/150 mL  Status:  Discontinued        750 mg 150 mL/hr over 60 Minutes Intravenous Every 24 hours 10/04/19 2134 10/05/19 1007   10/05/19 2030  ceFEPIme (MAXIPIME) 2 g in sodium chloride 0.9 % 100 mL IVPB  Status:  Discontinued        2 g 200 mL/hr over 30 Minutes Intravenous Every 24 hours 10/04/19 2134 10/06/19 1052   10/04/19 2000  vancomycin (VANCOREADY) IVPB 1500 mg/300 mL        1,500 mg 150 mL/hr over 120 Minutes Intravenous  Once 10/04/19 1950 10/04/19 2254   10/04/19 1930  ceFEPIme (MAXIPIME) 2 g in sodium chloride 0.9 % 100 mL IVPB        2 g 200 mL/hr over 30 Minutes Intravenous  Once 10/04/19 1927 10/04/19 2118   10/04/19 1930  metroNIDAZOLE (FLAGYL) IVPB 500 mg        500 mg 100 mL/hr over 60 Minutes Intravenous  Once 10/04/19 1927 10/04/19 2136   10/04/19 1930  vancomycin (VANCOCIN) IVPB 1000 mg/200 mL premix  Status:  Discontinued        1,000 mg 200 mL/hr over 60 Minutes Intravenous  Once 10/04/19 1927 10/04/19 1950       Subjective: Patient seen and evaluated today with no acute events noted overnight.  Objective: Vitals:   10/11/19 0801 10/11/19 1633 10/11/19 2130 10/12/19 0744  BP: (!) 134/102 (!) 134/97 (!) 140/87 (!) 150/113  Pulse: 101 (!) 108 105 (!) 43  Resp: _0 Temp: 97.8 F (36.6 C) 98.7 F (37.1 C) 98.6 F (37 C) 97.8 F (36.6 C)  TempSrc:   Oral   SpO2: 100% 100% 100% 100%  Weight:      Height:        Intake/Output Summary (Last 24 hours) at 10/12/2019 0939 Last data filed at 10/12/2019 0500 Gross per 24 hour  Intake --  Output 1201 ml  Net -1201 ml   Filed Weights   10/04/19 1949  Weight: 78.2 kg    Examination:  General exam: Appears calm and comfortable  Respiratory system: Clear to auscultation. Respiratory effort normal. Cardiovascular system: S1 & S2 heard, RRR. No JVD, murmurs, rubs, gallops or clicks. No pedal edema. Gastrointestinal system: Abdomen is soft Central nervous system:  Awake and poorly responsive Extremities: Dressings clean dry and intact on lower extremities Skin: No rashes, lesions or ulcers Psychiatry: Difficult to assess    Data Reviewed: I have personally reviewed following labs and imaging studies  CBC: Recent Labs  Lab 10/07/19 0945 10/09/19 0308 10/10/19 0610  WBC 10.9* 9.9 13.6*  HGB 9.9* 10.0* 10.7*  HCT 31.2* 31.0* 34.1*  MCV 97.8 96.3 98.6  PLT 191 308 657   Basic Metabolic Panel: Recent Labs  Lab 10/07/19 0945 10/09/19 0308 10/10/19 0610 10/11/19 0333 10/12/19 0351  NA 143 145 146* 149* 147*  K 3.9 4.5 5.2* 4.8 4.2  CL 115* 119* 120* 119* 119*  CO2 18* 17* 17* 19* 20*  GLUCOSE 119* 153* 118* 114* 110*  BUN 27* 39* 38* 34* 27*  CREATININE 1.14* 1.45* 1.15* 1.21* 1.13*  CALCIUM 8.3* 8.4* 8.9 8.8* 8.5*  MG  --  1.9  --   --   --    GFR: Estimated Creatinine  Clearance: 36.3 mL/min (A) (by C-G formula based on SCr of 1.13 mg/dL (H)). Liver Function Tests: No results for input(s): AST, ALT, ALKPHOS, BILITOT, PROT, ALBUMIN in the last 168 hours. No results for input(s): LIPASE, AMYLASE in the last 168 hours. No results for input(s): AMMONIA in the last 168 hours. Coagulation Profile: No results for input(s): INR, PROTIME in the last 168 hours. Cardiac Enzymes: No results for input(s): CKTOTAL, CKMB, CKMBINDEX, TROPONINI in the last 168 hours. BNP (last 3 results) No results for input(s): PROBNP in the last 8760 hours. HbA1C: No results for input(s): HGBA1C in the last 72 hours. CBG: Recent Labs  Lab 10/06/19 0745  GLUCAP 87   Lipid Profile: No results for input(s): CHOL, HDL, LDLCALC, TRIG, CHOLHDL, LDLDIRECT in the last 72 hours. Thyroid Function Tests: No results for input(s): TSH, T4TOTAL, FREET4, T3FREE, THYROIDAB in the last 72 hours. Anemia Panel: No results for input(s): VITAMINB12, FOLATE, FERRITIN, TIBC, IRON, RETICCTPCT in the last 72 hours. Sepsis Labs: No results for input(s): PROCALCITON,  LATICACIDVEN in the last 168 hours.  Recent Results (from the past 240 hour(s))  Urine culture     Status: Abnormal   Collection Time: 10/04/19  7:52 PM   Specimen: In/Out Cath Urine  Result Value Ref Range Status   Specimen Description IN/OUT CATH URINE  Final   Special Requests   Final    NONE Performed at [facility], [address] [place], [place] [address]    Culture >=100,000 COLONIES/mL ESCHERICHIA COLI (A)  Final   Report Status 10/06/2019 FINAL  Final   Organism ID, Bacteria ESCHERICHIA COLI (A)  Final      Susceptibility   Escherichia coli - MIC*    AMPICILLIN 4 SENSITIVE Sensitive     CEFAZOLIN <=4 SENSITIVE Sensitive     CEFTRIAXONE <=0.25 SENSITIVE Sensitive     CIPROFLOXACIN <=0.25 SENSITIVE Sensitive     GENTAMICIN <=1 SENSITIVE Sensitive     IMIPENEM <=0.25 SENSITIVE Sensitive     NITROFURANTOIN 128 RESISTANT Resistant     TRIMETH/SULFA <=20 SENSITIVE Sensitive     AMPICILLIN/SULBACTAM <=2 SENSITIVE Sensitive     PIP/TAZO <=4 SENSITIVE Sensitive     * >=100,000 COLONIES/mL ESCHERICHIA COLI  Blood Culture (routine x 2)     Status: None   Collection Time: 10/04/19  7:58 PM   Specimen: BLOOD  Result Value Ref Range Status   Specimen Description BLOOD SITE NOT SPECIFIED  Final   Special Requests   Final    BOTTLES DRAWN AEROBIC ONLY Blood Culture results may not be optimal due to an inadequate volume of blood received in culture bottles   Culture   Final    NO GROWTH 5 DAYS Performed at [facility], [place] [address], [place], [place] [address]    Report Status 10/09/2019 FINAL  Final  SARS Coronavirus 2 by RT PCR (hospital order, performed in [facility] hospital lab) Nasopharyngeal Nasopharyngeal Swab     Status: None   Collection Time: 10/04/19  7:58 PM   Specimen: Nasopharyngeal Swab  Result Value Ref Range Status   SARS Coronavirus 2 NEGATIVE NEGATIVE Final    Comment: (NOTE) SARS-CoV-2 target nucleic acids are NOT DETECTED.  The  SARS-CoV-2 RNA is generally detectable in upper and lower respiratory specimens during the acute phase of infection. The lowest concentration of SARS-CoV-2 viral copies this assay can detect is 250 copies / mL. A negative result does not preclude SARS-CoV-2 infection and should not be  used as the sole basis for treatment or other patient management decisions.  A negative result may occur with improper specimen collection / handling, submission of specimen other than nasopharyngeal swab, presence of viral mutation(s) within the areas targeted by this assay, and inadequate number of viral copies (<250 copies / mL). A negative result must be combined with clinical observations, patient history, and epidemiological information.  Fact Sheet for Patients:   StrictlyIdeas.no  Fact Sheet for Healthcare Providers: BankingDealers.co.za  This test is not yet approved or  cleared by the [place] FDA and has been authorized for detection and/or diagnosis of SARS-CoV-2 by FDA under an Emergency Use Authorization (EUA).  This EUA will remain in effect (meaning this test can be used) for the duration of the COVID-19 declaration under Section 564(b)(1) of the Act, 21 U.S.C. section 360bbb-3(b)(1), unless the authorization is terminated or revoked sooner.  Performed at [facility], [place] [address], [place], [place] [address]   Blood Culture (routine x 2)     Status: None   Collection Time: 10/04/19  8:00 PM   Specimen: BLOOD RIGHT ARM  Result Value Ref Range Status   Specimen Description BLOOD RIGHT ARM  Final   Special Requests   Final    BOTTLES DRAWN AEROBIC AND ANAEROBIC Blood Culture results may not be optimal due to an inadequate volume of blood received in culture bottles   Culture   Final    NO GROWTH 5 DAYS Performed at [facility], [place] [address], [place], [place] [address]    Report Status 10/09/2019 FINAL  Final    Gastrointestinal Panel by PCR , Stool     Status: None   Collection Time: 10/04/19  9:03 PM   Specimen: STOOL  Result Value Ref Range Status   Campylobacter species NOT DETECTED NOT DETECTED Final   Plesimonas shigelloides NOT DETECTED NOT DETECTED Final   Salmonella species NOT DETECTED NOT DETECTED Final   Yersinia enterocolitica NOT DETECTED NOT DETECTED Final   Vibrio species NOT DETECTED NOT DETECTED Final   Vibrio cholerae NOT DETECTED NOT DETECTED Final   Enteroaggregative E coli (EAEC) NOT DETECTED NOT DETECTED Final   Enteropathogenic E coli (EPEC) NOT DETECTED NOT DETECTED Final   Enterotoxigenic E coli (ETEC) NOT DETECTED NOT DETECTED Final   Shiga like toxin producing E coli (STEC) NOT DETECTED NOT DETECTED Final   Shigella/Enteroinvasive E coli (EIEC) NOT DETECTED NOT DETECTED Final   Cryptosporidium NOT DETECTED NOT DETECTED Final   Cyclospora cayetanensis NOT DETECTED NOT DETECTED Final   Entamoeba histolytica NOT DETECTED NOT DETECTED Final   Giardia lamblia NOT DETECTED NOT DETECTED Final   Adenovirus F40/41 NOT DETECTED NOT DETECTED Final   Astrovirus NOT DETECTED NOT DETECTED Final   Norovirus GI/GII NOT DETECTED NOT DETECTED Final   Rotavirus A NOT DETECTED NOT DETECTED Final   Sapovirus (I, II, IV, and V) NOT DETECTED NOT DETECTED Final    Comment: Performed at [facility], [address] [address] [place], [place] [address]  C Difficile Quick Screen w PCR reflex     Status: None   Collection Time: 10/04/19  9:03 PM   Specimen: STOOL  Result Value Ref Range Status   C Diff antigen NEGATIVE NEGATIVE Final   C Diff toxin NEGATIVE NEGATIVE Final   C Diff interpretation No C. difficile detected.  Final    Comment: Performed at [facility], [place] [address] [place], [place] [address]  MRSA PCR Screening  Status: None   Collection Time: 10/05/19  2:04 AM   Specimen: Nasal Mucosa; Nasopharyngeal  Result Value Ref Range Status   MRSA by PCR NEGATIVE  NEGATIVE Final    Comment:        The GeneXpert MRSA Assay (FDA approved for NASAL specimens only), is one component of a comprehensive MRSA colonization surveillance program. It is not intended to diagnose MRSA infection nor to guide or monitor treatment for MRSA infections. Performed at [facility], [place] [address], [place], [place] [address]          Radiology Studies: No results found.      Scheduled Meds:  (feeding supplement) PROSource Plus  30 mL Oral BID BM   brimonidine  1 drop Both Eyes TID   chlorhexidine gluconate (MEDLINE KIT)  15 mL Mouth Rinse BID   Chlorhexidine Gluconate Cloth  6 each Topical Daily   collagenase   Topical Daily   dorzolamide-timolol  1 drop Both Eyes BID   feeding supplement (ENSURE ENLIVE)  237 mL Oral TID BM   latanoprost  1 drop Both Eyes QHS   mouth rinse  15 mL Mouth Rinse 10 times per day   metoprolol tartrate  50 mg Oral BID   multivitamin with minerals  1 tablet Oral Daily   rivaroxaban  15 mg Oral Q supper   simvastatin  10 mg Oral QPM   Continuous Infusions:  sodium chloride 75 mL/hr at 10/10/19 0147   sodium chloride Stopped (10/05/19 0154)     LOS: 7 days    Time spent: 30 minutes    Mahalie Kanner Darleen Crocker, DO [facility]  If 7PM-7AM, please contact night-coverage [email or web address] 10/12/2019, 9:39 AM

## 2019-10-12 NOTE — TOC Initial Note (Signed)
Transition of Care [facility]) - Initial/Assessment Note    Patient Details  Name: Heather Singleton MRN: [id number] Date of Birth: [birth year]  [facility] CM/SW Contact:    Lawerance Sabal, RN Phone Number: 10/12/2019, 3:10 PM  Clinical Narrative:             Spoke w patient's spouse over the phone. We discussed disposition options, home hospice vs residential hospice. He admits that he wants to take her home, but that he could not provide the level of care that she needs. He states he has not been able to secure any additional home help from friends or family at this time. We discussed that insurance does not cover private duty care, 24 hour nursing care.  He asked if insurance covers residential hospice. I confirmed with him that hospice services are covered by medicare. He states again that he wants her to come home but knows that he cannot care for her and agrees that she will need more help. [facility] is known to him and he would like to speak to [facility] liaison to further discuss visitation policy. He states that he would be agreeable to referral to [facility]. Notified liaison on call.        Expected Discharge Plan: Hospice Medical Facility Barriers to Discharge: Continued Medical Work up   Patient Goals and CMS Choice Patient states their goals for this hospitalization and ongoing recovery are:: residential hospice CMS Medicare.gov Compare Post Acute Care list provided to:: Other (Comment Required) Choice offered to / list presented to : Spouse  Expected Discharge Plan and Services Expected Discharge Plan: Hospice Medical Facility                                              Prior Living Arrangements/Services                       Activities of Daily Living Home Assistive Devices/Equipment: Other (Comment) (uta) ADL Screening (condition at time of admission) Patient's cognitive ability adequate to safely complete daily activities?: No Is the patient  deaf or have difficulty hearing?: No Does the patient have difficulty seeing, even when wearing glasses/contacts?: Yes Does the patient have difficulty concentrating, remembering, or making decisions?: Yes Patient able to express need for assistance with ADLs?: No Does the patient have difficulty dressing or bathing?: Yes Independently performs ADLs?: No Communication: Dependent Is this a change from baseline?: Pre-admission baseline Dressing (OT): Dependent Is this a change from baseline?: Pre-admission baseline Grooming: Dependent Is this a change from baseline?: Pre-admission baseline Feeding: Dependent Is this a change from baseline?: Pre-admission baseline Bathing: Dependent Is this a change from baseline?: Pre-admission baseline Toileting: Dependent Is this a change from baseline?: Pre-admission baseline In/Out Bed: Dependent Is this a change from baseline?: Pre-admission baseline Walks in Home: Dependent Is this a change from baseline?: Pre-admission baseline Does the patient have difficulty walking or climbing stairs?: Yes Weakness of Legs: Both Weakness of Arms/Hands: Both  Permission Sought/Granted                  Emotional Assessment              Admission diagnosis:  Diarrhea of infectious origin [A09] Acute cystitis without hematuria [N30.00] AKI (acute kidney injury) (HCC) [N17.9] Septic shock (HCC) [A41.9, R65.21] Patient Active Problem List  Diagnosis Date Noted  . Acute cystitis without hematuria   . AKI (acute kidney injury) (HCC)   . Severe protein-calorie malnutrition (HCC)   . Septic shock (HCC) 10/05/2019  . Palliative care by specialist   . Goals of care, counseling/discussion   . DNR (do not resuscitate)    PCP:  Fleet Contras, MD Pharmacy:   [facility] [address] ([place]), [place] - [address] [place] ([place]) [place] [address] Phone: [phone] Fax: [phone]     Social Determinants  of Health (SDOH) Interventions    Readmission Risk Interventions No flowsheet data found.

## 2019-10-13 DIAGNOSIS — G9341 Metabolic encephalopathy: Secondary | ICD-10-CM

## 2019-10-13 DIAGNOSIS — L89150 Pressure ulcer of sacral region, unstageable: Secondary | ICD-10-CM

## 2019-10-13 MED ORDER — ONDANSETRON HCL 4 MG PO TABS: 4.0000 mg | ORAL_TABLET | Freq: Every day | ORAL | 1 refills | Status: AC | PRN

## 2019-10-13 MED ORDER — MORPHINE SULFATE (CONCENTRATE) 10 MG /0.5 ML PO SOLN: 10.0000 mg | ORAL | 0 refills | Status: AC | PRN

## 2019-10-13 MED ORDER — GLYCOPYRROLATE 1 MG PO TABS: 1.0000 mg | ORAL_TABLET | Freq: Three times a day (TID) | ORAL | 0 refills | Status: AC | PRN

## 2019-10-13 MED ORDER — LORAZEPAM 0.5 MG PO TABS
0.5000 mg | ORAL_TABLET | ORAL | 0 refills | Status: AC | PRN
Start: 2019-10-13 — End: ?

## 2019-10-13 MED ORDER — KETOROLAC TROMETHAMINE 15 MG/ML IJ SOLN
15.0000 mg | Freq: Three times a day (TID) | INTRAMUSCULAR | Status: DC | PRN
  Administered 2019-10-13: 15 mg via INTRAVENOUS

## 2019-10-13 NOTE — Progress Notes (Signed)
Report called to [place] at [phone] [facility].

## 2019-10-13 NOTE — TOC Transition Note (Signed)
Transition of Care [facility]) - CM/SW Discharge Note   Patient Details  Name: Heather Singleton MRN: [id number] Date of Birth: [birth year]  Transition of Care [facility]) CM/SW Contact:  Jimmy Picket, LCSWA Phone Number: 10/13/2019, 1:06 PM   Clinical Narrative:     Pt will discharge to [facility] via ptar. Pts husband Greig Castilla has been notified.   Nurse can call report to [phone].  Final next level of care: Skilled Nursing Facility Barriers to Discharge: Barriers Resolved   Patient Goals and CMS Choice Patient states their goals for this hospitalization and ongoing recovery are:: residential hospice CMS Medicare.gov Compare Post Acute Care list provided to:: Patient Choice offered to / list presented to : Spouse  Discharge Placement              Patient chooses bed at: Other - please specify in the comment section below: ([facility]) Patient to be transferred to facility by: [facility] Name of family member notified: Greig Castilla- husband Patient and family notified of of transfer: 10/13/19  Discharge Plan and Services                                     Social Determinants of Health (SDOH) Interventions     Readmission Risk Interventions No flowsheet data found.

## 2019-10-13 NOTE — Discharge Summary (Signed)
Physician Discharge Summary  Heather Singleton:[id number] DOB: December 09, 1930 DOA: 10/04/2019  PCP: Fleet Contras, MD  Admit date: 10/04/2019 Discharge date: 10/13/2019  Admitted From: Home Disposition: Residential hospice ([facility])  Discharge Condition: Stable but guarded prognosis CODE STATUS: DNR/DNI   Hospital Course: 86 y.o.femalewith hx HTN presented 7/17 with weakness and diarrhea. She is cared for at home by her husband who is poor historian. She has significant sacral decub ulcers. Reportedly wasn't feeling well x1 week, called PCP who gave abx for UTI but she continued to deteriorate at home. On arrival to ER was hypotensive with SBP 70's, afib with RVR 150's, u/a dirty, lactate 3.4, Scr 2, WBC 17.8, somnolent mental status. BP, HR and mental status improved somewhat with volume but BP remains borderline so PCCM asked to admit to ICU.Tx out to [facility] on 7/20. Poor nutritional status. Concern for ability to heal wound.Palliative care consulted.  Patient continued to be somnolent and deconditioned. Eventually, patient was transitioned to residential hospice after discussion with patient's husband, and discharged to [facility] for end-of-life care.  See individual problems below for more hospital course.  Discharge Diagnoses:  End-of-life care/DNR/DNI -Discharged to [facility] for end of life care. -Roxanol, Ativan, glycopyrrolate and Zofran Rx'd  Sepsis with septic shock 2/2 E. Coli UTI; shock resolved -completed 7-day course of Rocephin. Blood cultures negative. -No growth in blood cultures  Unstageable pressure wounds to the coccyx, left and right ischial tuberosities -recommendations:Would not recommend any surgical debridement at this time as she likely would not heal these areas. Continue santyl to necrotic areas and add xeroform/petroleum gauze to help soften the necrotic tissue and to protect the macerated areas. If patient improves and leaves the  hospital she could benefit from referral to the outpatient wound clinic.  Anemia of chronic disease  Chronic afib  AKI-resolved  Acute metabolic encephalopathy, unclear if baseline dementia  Severe protein calorie malnutrition  Hyperkalemia-resolved  Debility/physical deconditioning   Body mass index is 28.69 kg/m. Nutrition Problem: Increased nutrient needs Etiology: wound healing Signs/Symptoms: estimated needs Interventions: Ensure Enlive (each supplement provides 350kcal and 20 grams of protein), MVI, Prostat   Pressure Injury 10/04/19 Buttocks Left Unstageable - Full thickness tissue loss in which the base of the injury is covered by slough (yellow, tan, gray, green or brown) and/or eschar (tan, brown or black) in the wound bed. (Active)  10/04/19 1930  Location: Buttocks  Location Orientation: Left  Staging: Unstageable - Full thickness tissue loss in which the base of the injury is covered by slough (yellow, tan, gray, green or brown) and/or eschar (tan, brown or black) in the wound bed.  Wound Description (Comments):   Present on Admission: Yes     Pressure Injury 10/05/19 Buttocks Right Unstageable - Full thickness tissue loss in which the base of the injury is covered by slough (yellow, tan, gray, green or brown) and/or eschar (tan, brown or black) in the wound bed. approximate 5.5x10 cm black ar (Active)  10/05/19 0215  Location: Buttocks  Location Orientation: Right  Staging: Unstageable - Full thickness tissue loss in which the base of the injury is covered by slough (yellow, tan, gray, green or brown) and/or eschar (tan, brown or black) in the wound bed.  Wound Description (Comments): approximate 5.5x10 cm black area   Present on Admission: Yes     Pressure Injury 10/05/19 Buttocks Right Unstageable - Full thickness tissue loss in which the base of the injury is covered by slough (yellow, tan,  gray, green or brown) and/or eschar (tan, brown or black) in  the wound bed. approximate 8.5x6 cm (Active)  10/05/19 0215  Location: Buttocks  Location Orientation: Right  Staging: Unstageable - Full thickness tissue loss in which the base of the injury is covered by slough (yellow, tan, gray, green or brown) and/or eschar (tan, brown or black) in the wound bed.  Wound Description (Comments): approximate 8.5x6 cm  Present on Admission: Yes    Discharge Exam: Vitals:   10/13/19 0558 10/13/19 0755  BP: (!) 140/99 (!) 138/85  Pulse: 100 90  Resp: 18 16  Temp: 98.5 F (36.9 C) 98.2 F (36.8 C)  SpO2: 97% 100%    GENERAL: No apparent distress.  HEENT: MMM.  Vision and hearing grossly intact.  NECK: Supple.  No apparent JVD.  RESP: 97% on room air. No IWOB.  CVS:  RRR. Heart sounds normal.  ABD/GI/GU: Bowel sounds present. Soft. Non tender.  SKIN: Unstageable pressure ulcers as below. NEURO: Somnolent. Most of physical stimuli. No apparent distress.  PSYCH: Somnolent.  Discharge Instructions  Discharge Instructions    Increase activity slowly   Complete by: As directed    No wound care   Complete by: As directed      Allergies as of 10/13/2019      Reactions   Latanoprost Shortness Of Breath   Had asthma as a child, and her MD told her that the medication could be causing her problems   Tomato Other (See Comments)   Acid reflux issues      Medication List    STOP taking these medications   Dexilant 60 MG capsule Generic drug: dexlansoprazole   diphenoxylate-atropine 2.5-0.025 MG tablet Commonly known as: LOMOTIL   furosemide 20 MG tablet Commonly known as: LASIX   HALLS COUGH DROPS MT   lisinopril-hydrochlorothiazide 10-12.5 MG tablet Commonly known as: ZESTORETIC   meclizine 25 MG tablet Commonly known as: ANTIVERT   metoprolol succinate 25 MG 24 hr tablet Commonly known as: TOPROL-XL   nitrofurantoin (macrocrystal-monohydrate) 100 MG capsule Commonly known as: MACROBID   simvastatin 10 MG tablet Commonly  known as: ZOCOR   Xarelto 20 MG Tabs tablet Generic drug: rivaroxaban     TAKE these medications   brimonidine 0.2 % ophthalmic solution Commonly known as: ALPHAGAN Place 1 drop into both eyes 3 (three) times daily.   dorzolamide-timolol 22.3-6.8 MG/ML ophthalmic solution Commonly known as: COSOPT Place 1 drop into both eyes 2 (two) times daily.   glycopyrrolate 1 MG tablet Commonly known as: ROBINUL Take 1 tablet (1 mg total) by mouth 3 (three) times daily as needed (Oropharyngeal secretions).   latanoprost 0.005 % ophthalmic solution Commonly known as: XALATAN Place 1 drop into both eyes at bedtime.   LORazepam 0.5 MG tablet Commonly known as: Ativan Take 1 tablet (0.5 mg total) by mouth every 4 (four) hours as needed for up to 20 doses for anxiety.   morphine CONCENTRATE 10 mg / 0.5 ml concentrated solution Take 0.5 mLs (10 mg total) by mouth every 3 (three) hours as needed for moderate pain, severe pain or shortness of breath.   ondansetron 4 MG tablet Commonly known as: Zofran Take 1 tablet (4 mg total) by mouth daily as needed for nausea or vomiting.       Consultations:  PCCM  Palliative medicine    CT ABDOMEN PELVIS WO CONTRAST  Result Date: 10/04/2019 CLINICAL DATA:  86 year old female complaining of diarrhea for the past 4-5 days EXAM: CT  ABDOMEN AND PELVIS WITHOUT CONTRAST TECHNIQUE: Multidetector CT imaging of the abdomen and pelvis was performed following the standard protocol without IV contrast. COMPARISON:  None. FINDINGS: Lower chest: Atelectatic changes in the lung bases. Mild cardiomegaly. Hypoattenuation of the cardiac blood pool relative to the myocardium compatible with anemia. Few calcifications on the aortic leaflets as well as coronary artery calcifications. No pericardial effusion. Hepatobiliary: No discernible hepatic lesions on this unenhanced CT further limited by extensive respiratory motion most pronounced in the upper abdomen. Normal  hepatic attenuation. Smooth liver surface contour. No gross abnormality of the gallbladder biliary tree without visible calcified gallstones. Pancreas: Unremarkable. No pancreatic ductal dilatation or surrounding inflammatory changes. Spleen: Normal in size without focal abnormality. Adrenals/Urinary Tract: Mild lobular thickening of the adrenal glands without concerning dominant nodules. Fluid attenuation cyst present in the lower pole right kidney measuring up to 2.6 cm in size. Few coarse upper pole calcifications in the right kidney and punctate lower pole calcification in the left kidney without evidence of obstructive urolithiasis or hydronephrosis. Bladder partially decompressed by Foley catheter with intraluminal gas likely related to instrumentation Stomach/Bowel: Distal esophagus, stomach and duodenum are grossly unremarkable accounting for decompression and extensive motion artifact. Few clustered loops of small bowel in the left upper abdomen with some surrounding hazy mesenteric stranding versus motion artifact. Much of the remaining small bowel is unremarkable. No evidence of obstruction. A normal appendix is visualized. Moderate colonic stool burden. No proximal colonic thickening or dilatation. The rectosigmoid contains an inspissated stool ball measuring up to 6.2 cm in diameter with some circumferential mural thickening and perirectal/mesorectal fat stranding and presacral thickening. Vascular/Lymphatic: Atherosclerotic calcifications within the abdominal aorta and branch vessels. No aneurysm or ectasia. No enlarged abdominopelvic lymph nodes within the limitations of this motion degraded exam. Reproductive: Uterus is surgically absent. No concerning adnexal lesions. Other: Trace free fluid in the pelvis likely related to inflammatory changes of the bowel, as detailed above. Small ventral diastasis/broad-based umbilical hernia with Richter's type protrusion of the anti mesenteric wall of the  transverse colon without evidence of resulting obstruction or vascular compromise. No other bowel containing hernia. Musculoskeletal: The osseous structures appear diffusely demineralized which may limit detection of small or nondisplaced fractures. No acute osseous abnormality or suspicious osseous lesion. Multilevel degenerative changes are present in the imaged portions of the spine. Additional degenerative changes of the hips and pelvis. IMPRESSION: 1. The rectosigmoid contains an inspissated stool ball measuring up to 6.2 cm in diameter with some circumferential mural thickening and perirectal/mesorectal fat stranding and presacral thickening. Findings are concerning for fecal impaction and stercoral colitis. 2. Few clustered loops of small bowel in the left upper abdomen with some surrounding hazy mesenteric stranding versus motion artifact. This finding is nonspecific but may reflect an enteritis. 3. Small ventral diastasis/broad-based umbilical hernia with Richter's type protrusion of the anti mesenteric wall of the transverse colon without evidence of resulting obstruction or vascular compromise. 4. Aortic Atherosclerosis (ICD10-I70.0). Electronically Signed   By: Kreg Shropshire M.D.   On: 10/04/2019 22:23   CT Head Wo Contrast  Result Date: 10/04/2019 CLINICAL DATA:  Altered mental status EXAM: CT HEAD WITHOUT CONTRAST TECHNIQUE: Contiguous axial images were obtained from the base of the skull through the vertex without intravenous contrast. COMPARISON:  Jul 22, 2015 FINDINGS: Brain: No evidence of acute territorial infarction, hemorrhage, hydrocephalus,extra-axial collection or mass lesion/mass effect. There is dilatation the ventricles and sulci consistent with age-related atrophy. Low-attenuation changes in the deep white matter consistent  with small vessel ischemia. 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 Vascular: No hyperdense vessel or unexpected calcification. Skull: The skull is intact.  No fracture or focal lesion identified. Sinuses/Orbits: The visualized paranasal sinuses and mastoid air cells are clear. The orbits and globes intact. Other: None IMPRESSION: No acute intracranial abnormality. Findings consistent with age related atrophy and chronic small vessel ischemia Electronically Signed   By: Jonna ClarkBindu  Avutu M.D.   On: 10/04/2019 22:17   DG CHEST PORT 1 VIEW  Result Date: 10/07/2019 CLINICAL DATA:  Sepsis, hypertension, leukocytosis, altered mental status EXAM: PORTABLE CHEST 1 VIEW COMPARISON:  10/04/2019 FINDINGS: Since the prior examination, there has developed moderate interstitial pulmonary infiltrate within the perihilar and lower lung zones as well as small bilateral pleural effusions most in keeping with moderate cardiogenic failure. No pneumothorax. Mild cardiomegaly is stable. In no acute bone abnormality. IMPRESSION: Interval development of moderate cardiogenic failure. Small bilateral pleural effusions. Electronically Signed   By: Helyn NumbersAshesh  Parikh MD   On: 10/07/2019 20:01   DG Chest Port 1 View  Result Date: 10/04/2019 CLINICAL DATA:  Weakness. EXAM: PORTABLE CHEST 1 VIEW COMPARISON:  September 28, 2009 FINDINGS: The lungs are hyperinflated. Mild, diffuse chronic appearing increased lung markings are seen. There is no evidence of acute infiltrate, pleural effusion or pneumothorax. The cardiac silhouette is moderately enlarged. There is mild calcification of the aortic arch. The visualized skeletal structures are unremarkable. IMPRESSION: No active disease. Electronically Signed   By: Aram Candelahaddeus  Houston M.D.   On: 10/04/2019 20:49        The results of significant diagnostics from this hospitalization (including imaging, microbiology, ancillary and laboratory) are listed below for reference.     Microbiology: Recent Results (from the past 240 hour(s))  Urine culture     Status: Abnormal   Collection Time: 10/04/19  7:52 PM   Specimen: In/Out Cath Urine  Result Value  Ref Range Status   Specimen Description IN/OUT CATH URINE  Final   Special Requests   Final    NONE Performed at [facility], [address] [place], [place] [address]    Culture >=100,000 COLONIES/mL ESCHERICHIA COLI (A)  Final   Report Status 10/06/2019 FINAL  Final   Organism ID, Bacteria ESCHERICHIA COLI (A)  Final      Susceptibility   Escherichia coli - MIC*    AMPICILLIN 4 SENSITIVE Sensitive     CEFAZOLIN <=4 SENSITIVE Sensitive     CEFTRIAXONE <=0.25 SENSITIVE Sensitive     CIPROFLOXACIN <=0.25 SENSITIVE Sensitive     GENTAMICIN <=1 SENSITIVE Sensitive     IMIPENEM <=0.25 SENSITIVE Sensitive     NITROFURANTOIN 128 RESISTANT Resistant     TRIMETH/SULFA <=20 SENSITIVE Sensitive     AMPICILLIN/SULBACTAM <=2 SENSITIVE Sensitive     PIP/TAZO <=4 SENSITIVE Sensitive     * >=100,000 COLONIES/mL ESCHERICHIA COLI  Blood Culture (routine x 2)     Status: None   Collection Time: 10/04/19  7:58 PM   Specimen: BLOOD  Result Value Ref Range Status   Specimen Description BLOOD SITE NOT SPECIFIED  Final   Special Requests   Final    BOTTLES DRAWN AEROBIC ONLY Blood Culture results may not be optimal due to an inadequate volume of blood received in culture bottles   Culture   Final    NO GROWTH 5 DAYS Performed at [facility], [address] [place], [place] [address]    Report Status 10/09/2019 FINAL  Final  SARS Coronavirus 2 by RT PCR (hospital order, performed in [facility] hospital lab) Nasopharyngeal Nasopharyngeal Swab     Status: None   Collection Time: 10/04/19  7:58 PM   Specimen: Nasopharyngeal Swab  Result Value Ref Range Status   SARS Coronavirus 2 NEGATIVE NEGATIVE Final    Comment: (NOTE) SARS-CoV-2 target nucleic acids are NOT DETECTED.  The SARS-CoV-2 RNA is generally detectable in upper and lower respiratory specimens during the acute phase of infection. The lowest concentration of SARS-CoV-2 viral copies this assay can detect is 250 copies /  mL. A negative result does not preclude SARS-CoV-2 infection and should not be used as the sole basis for treatment or other patient management decisions.  A negative result may occur with improper specimen collection / handling, submission of specimen other than nasopharyngeal swab, presence of viral mutation(s) within the areas targeted by this assay, and inadequate number of viral copies (<250 copies / mL). A negative result must be combined with clinical observations, patient history, and epidemiological information.  Fact Sheet for Patients:   BoilerBrush.com.cy  Fact Sheet for Healthcare Providers: https://pope.com/  This test is not yet approved or  cleared by the [place] FDA and has been authorized for detection and/or diagnosis of SARS-CoV-2 by FDA under an Emergency Use Authorization (EUA).  This EUA will remain in effect (meaning this test can be used) for the duration of the COVID-19 declaration under Section 564(b)(1) of the Act, 21 U.S.C. section 360bbb-3(b)(1), unless the authorization is terminated or revoked sooner.  Performed at [facility], [address] [place], [place] [address]   Blood Culture (routine x 2)     Status: None   Collection Time: 10/04/19  8:00 PM   Specimen: BLOOD RIGHT ARM  Result Value Ref Range Status   Specimen Description BLOOD RIGHT ARM  Final   Special Requests   Final    BOTTLES DRAWN AEROBIC AND ANAEROBIC Blood Culture results may not be optimal due to an inadequate volume of blood received in culture bottles   Culture   Final    NO GROWTH 5 DAYS Performed at [facility], [address], [place], [place] [address]    Report Status 10/09/2019 FINAL  Final  Gastrointestinal Panel by PCR , Stool     Status: None   Collection Time: 10/04/19  9:03 PM   Specimen: STOOL  Result Value Ref Range Status   Campylobacter species NOT DETECTED NOT DETECTED Final   Plesimonas  shigelloides NOT DETECTED NOT DETECTED Final   Salmonella species NOT DETECTED NOT DETECTED Final   Yersinia enterocolitica NOT DETECTED NOT DETECTED Final   Vibrio species NOT DETECTED NOT DETECTED Final   Vibrio cholerae NOT DETECTED NOT DETECTED Final   Enteroaggregative E coli (EAEC) NOT DETECTED NOT DETECTED Final   Enteropathogenic E coli (EPEC) NOT DETECTED NOT DETECTED Final   Enterotoxigenic E coli (ETEC) NOT DETECTED NOT DETECTED Final   Shiga like toxin producing E coli (STEC) NOT DETECTED NOT DETECTED Final   Shigella/Enteroinvasive E coli (EIEC) NOT DETECTED NOT DETECTED Final   Cryptosporidium NOT DETECTED NOT DETECTED Final   Cyclospora cayetanensis NOT DETECTED NOT DETECTED Final   Entamoeba histolytica NOT DETECTED NOT DETECTED Final   Giardia lamblia NOT DETECTED NOT DETECTED Final   Adenovirus F40/41 NOT DETECTED NOT DETECTED Final   Astrovirus NOT DETECTED NOT DETECTED Final   Norovirus GI/GII NOT DETECTED NOT DETECTED Final   Rotavirus A NOT DETECTED NOT DETECTED Final  Sapovirus (I, II, IV, and V) NOT DETECTED NOT DETECTED Final    Comment: Performed at [facility], [address] [place], [place] [address]  C Difficile Quick Screen w PCR reflex     Status: None   Collection Time: 10/04/19  9:03 PM   Specimen: STOOL  Result Value Ref Range Status   C Diff antigen NEGATIVE NEGATIVE Final   C Diff toxin NEGATIVE NEGATIVE Final   C Diff interpretation No C. difficile detected.  Final    Comment: Performed at [facility], [address], [place], [place] [address]  MRSA PCR Screening     Status: None   Collection Time: 10/05/19  2:04 AM   Specimen: Nasal Mucosa; Nasopharyngeal  Result Value Ref Range Status   MRSA by PCR NEGATIVE NEGATIVE Final    Comment:        The GeneXpert MRSA Assay (FDA approved for NASAL specimens only), is one component of a comprehensive MRSA colonization surveillance program. It is not intended to diagnose  MRSA infection nor to guide or monitor treatment for MRSA infections. Performed at [facility], [address], [place], [place] [address]      Labs: BNP (last 3 results) No results for input(s): BNP in the last 8760 hours. Basic Metabolic Panel: Recent Labs  Lab 10/07/19 0945 10/09/19 0308 10/10/19 0610 10/11/19 0333 10/12/19 0351  NA 143 145 146* 149* 147*  K 3.9 4.5 5.2* 4.8 4.2  CL 115* 119* 120* 119* 119*  CO2 18* 17* 17* 19* 20*  GLUCOSE 119* 153* 118* 114* 110*  BUN 27* 39* 38* 34* 27*  CREATININE 1.14* 1.45* 1.15* 1.21* 1.13*  CALCIUM 8.3* 8.4* 8.9 8.8* 8.5*  MG  --  1.9  --   --   --    Liver Function Tests: No results for input(s): AST, ALT, ALKPHOS, BILITOT, PROT, ALBUMIN in the last 168 hours. No results for input(s): LIPASE, AMYLASE in the last 168 hours. No results for input(s): AMMONIA in the last 168 hours. CBC: Recent Labs  Lab 10/07/19 0945 10/09/19 0308 10/10/19 0610  WBC 10.9* 9.9 13.6*  HGB 9.9* 10.0* 10.7*  HCT 31.2* 31.0* 34.1*  MCV 97.8 96.3 98.6  PLT 191 308 344   Cardiac Enzymes: No results for input(s): CKTOTAL, CKMB, CKMBINDEX, TROPONINI in the last 168 hours. BNP: Invalid input(s): POCBNP CBG: No results for input(s): GLUCAP in the last 168 hours. D-Dimer No results for input(s): DDIMER in the last 72 hours. Hgb A1c No results for input(s): HGBA1C in the last 72 hours. Lipid Profile No results for input(s): CHOL, HDL, LDLCALC, TRIG, CHOLHDL, LDLDIRECT in the last 72 hours. Thyroid function studies No results for input(s): TSH, T4TOTAL, T3FREE, THYROIDAB in the last 72 hours.  Invalid input(s): FREET3 Anemia work up No results for input(s): VITAMINB12, FOLATE, FERRITIN, TIBC, IRON, RETICCTPCT in the last 72 hours. Urinalysis    Component Value Date/Time   COLORURINE AMBER (A) 10/04/2019 1952   APPEARANCEUR CLOUDY (A) 10/04/2019 1952   LABSPEC 1.016 10/04/2019 1952   PHURINE 5.0 10/04/2019 1952   GLUCOSEU NEGATIVE  10/04/2019 1952   HGBUR MODERATE (A) 10/04/2019 1952   BILIRUBINUR NEGATIVE 10/04/2019 1952   KETONESUR NEGATIVE 10/04/2019 1952   PROTEINUR 30 (A) 10/04/2019 1952   NITRITE POSITIVE (A) 10/04/2019 1952   LEUKOCYTESUR LARGE (A) 10/04/2019 1952   Sepsis Labs Invalid input(s): PROCALCITONIN,  WBC,  LACTICIDVEN   Time coordinating discharge: 35 minutes  SIGNED:  Alwyn Ren  Ludwig Lean, MD  [facility] 10/13/2019, 10:57 AM  If 7PM-7AM, please contact night-coverage [email or web address] Password [email or web address]

## 2019-10-13 NOTE — Progress Notes (Addendum)
Civil engineer, contracting [facility])  [facility] has a bed for Mrs. Okelly today.  Her husband will need to complete necessary consents prior to her being able to transfer.  Updated TOC manager with above plan, will update once all is complete and transport can be arranged at that time.  Report can be called at any time to [phone], bed is assigned at that time.  Please fax d/c summary to [phone]  Wallis Bamberg RN, BSN, CCRN [facility] Liaison   **all necessary consents are completed and Terrilee Files is ready for Ms. Goforth to be transported as soon as it can be arranged.  TOC manager aware.

## 2019-11-19 DEATH — deceased

## 2022-02-17 IMAGING — CT CT HEAD W/O CM
4 series · 16 of 47 positions shown, 18 images · non-contrast
Comparison: July 22, 2015

CLINICAL DATA: Altered mental status

EXAM:
CT HEAD WITHOUT CONTRAST
TECHNIQUE: Contiguous axial images were obtained from the base of the skull
through the vertex without intravenous contrast.

[Series 3: head wo · axial · 0.54mm/px · z∈[-189,-49]mm · 7 of 38 slices shown, 9 images]
[im 5/38  brain]
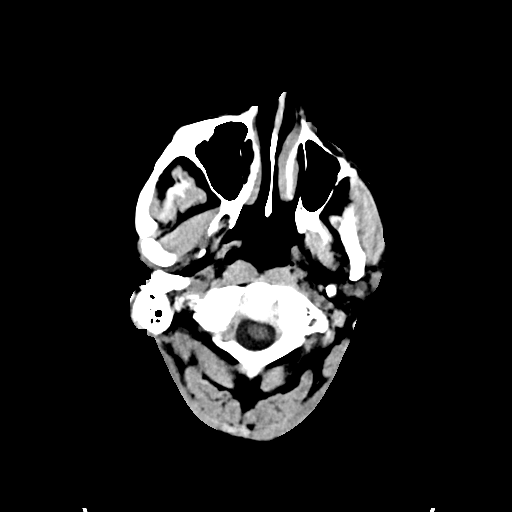
[im 5/38  bone]
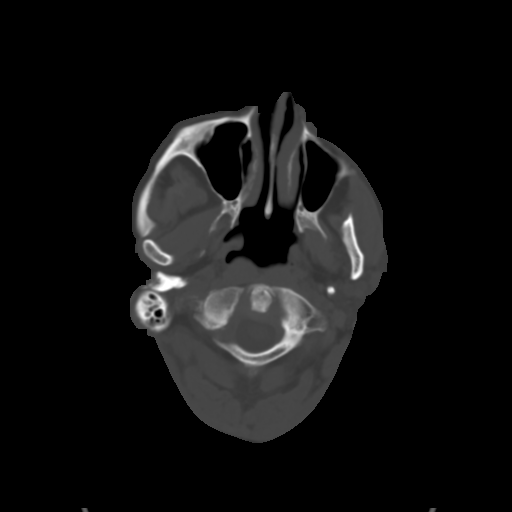
[im 10/38  brain]
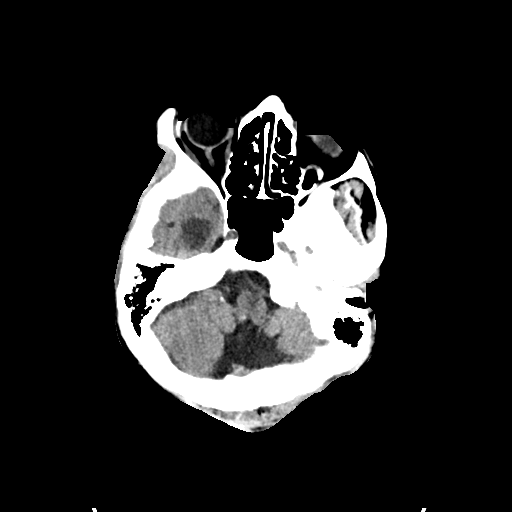
[im 14/38  brain]
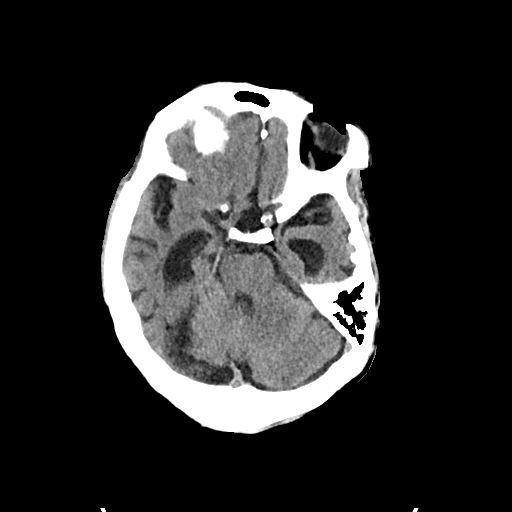
[im 19/38  brain]
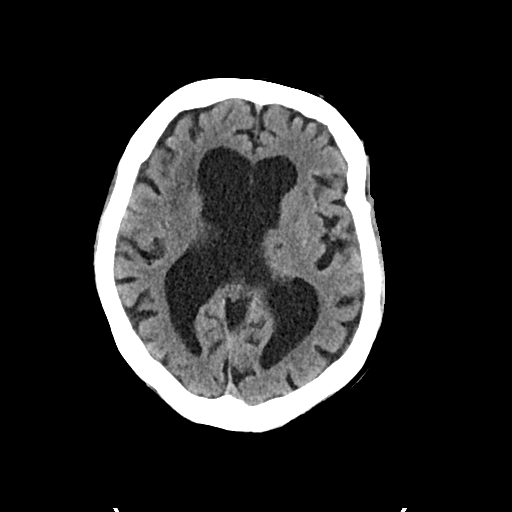
[im 24/38  brain]
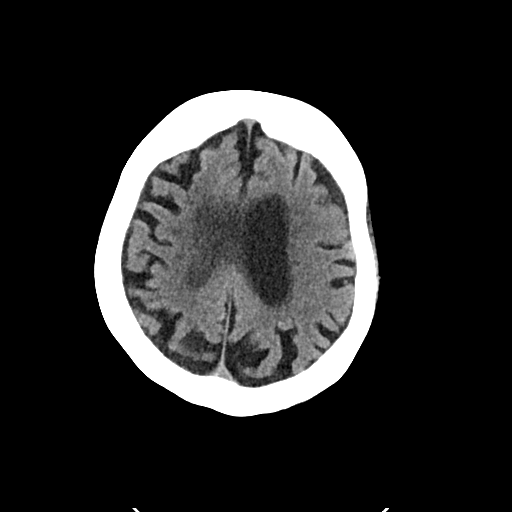
[im 24/38  bone]
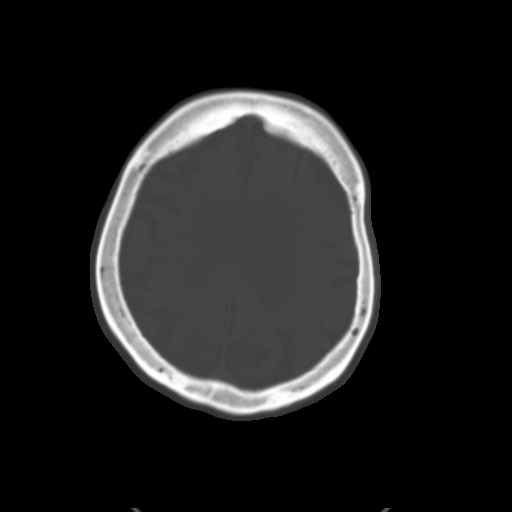
[im 28/38  brain]
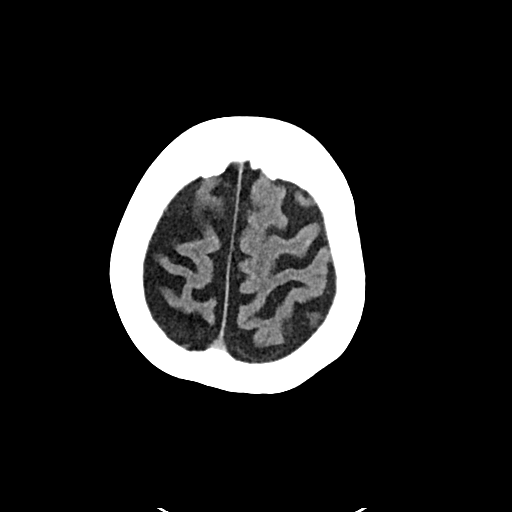
[im 33/38  brain]
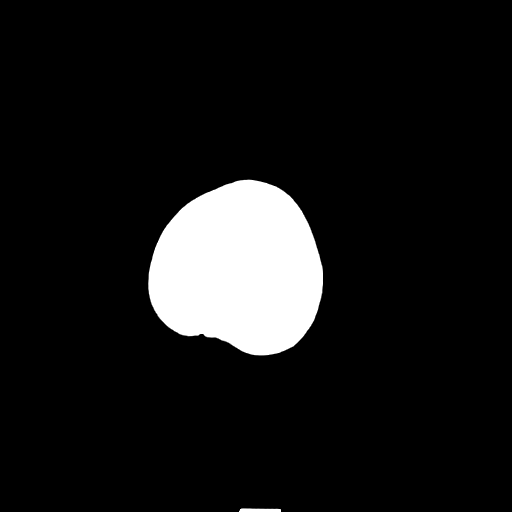

[Series 4: head bone · axial · 0.54mm/px · z∈[-191,-155]mm · 3 of 94 slices shown]
[im 10/94  bone]
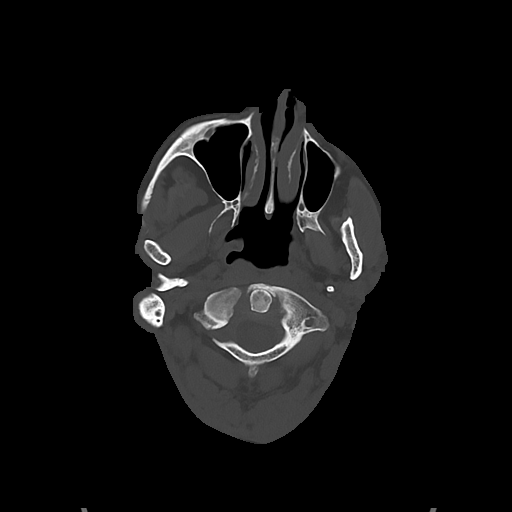
[im 19/94  bone]
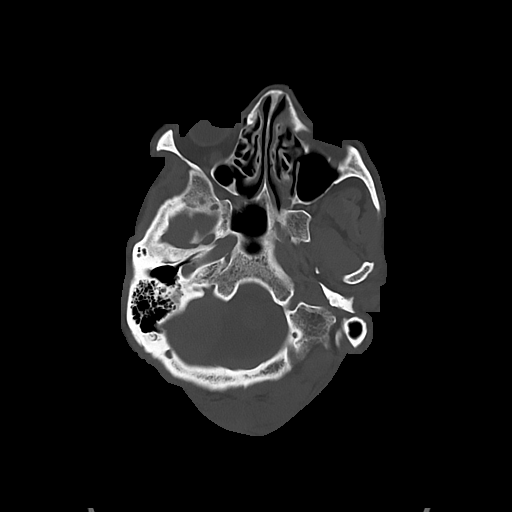
[im 28/94  bone]
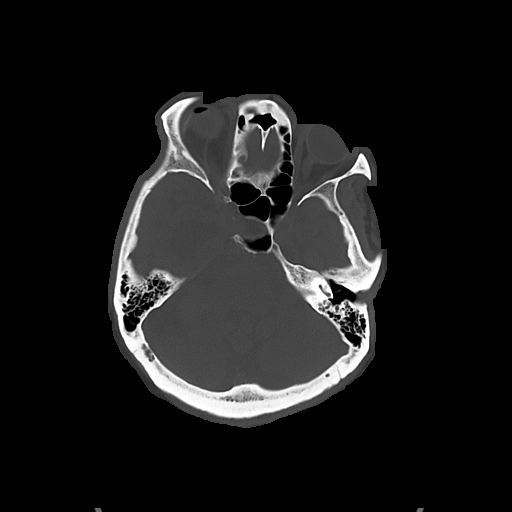

[Series 5: cor soft · coronal · 0.36mm/px · 3 of 79 slices shown]
[im 27/79  brain]
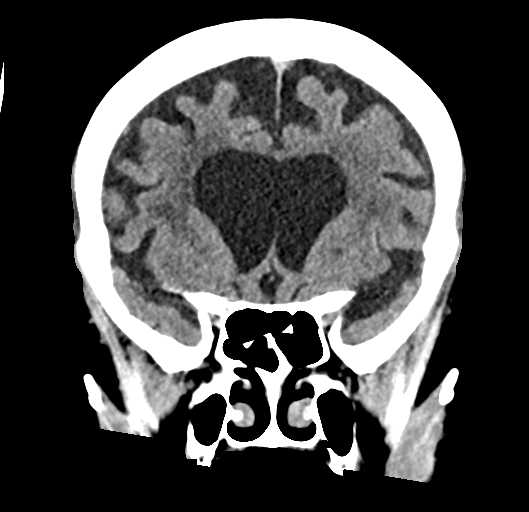
[im 35/79  brain]
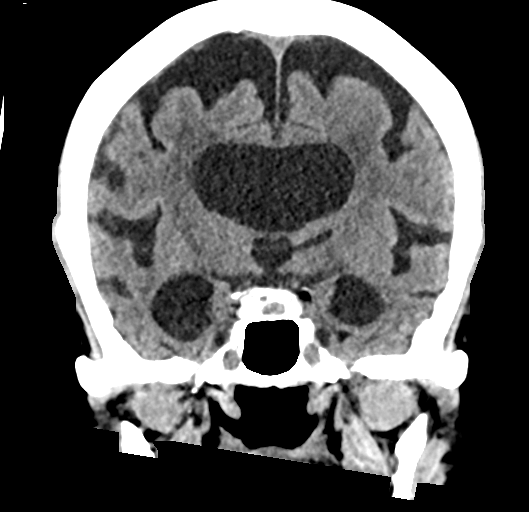
[im 44/79  brain]
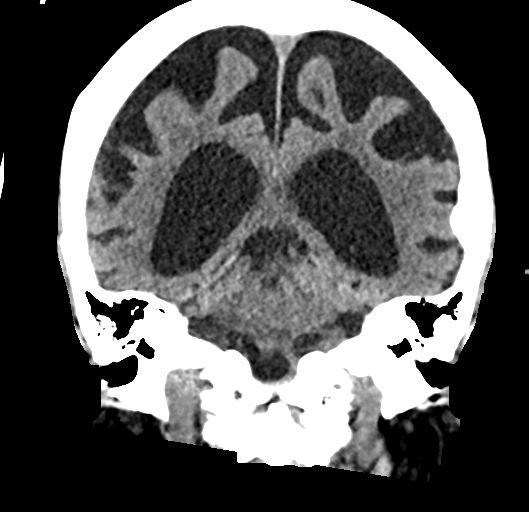

[Series 6: sag soft · sagittal · 0.36mm/px · 3 of 62 slices shown]
[im 24/62  brain]
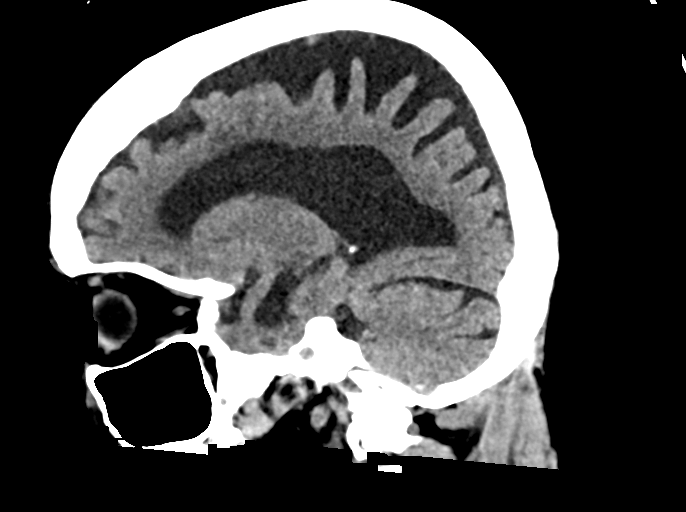
[im 31/62  brain]
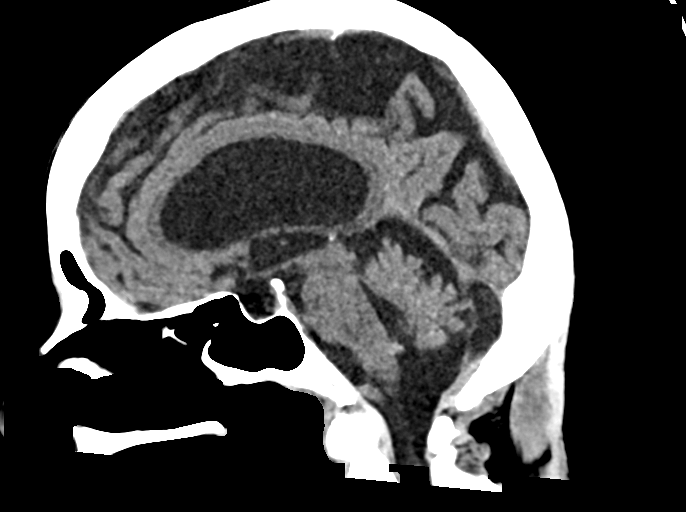
[im 37/62  brain]
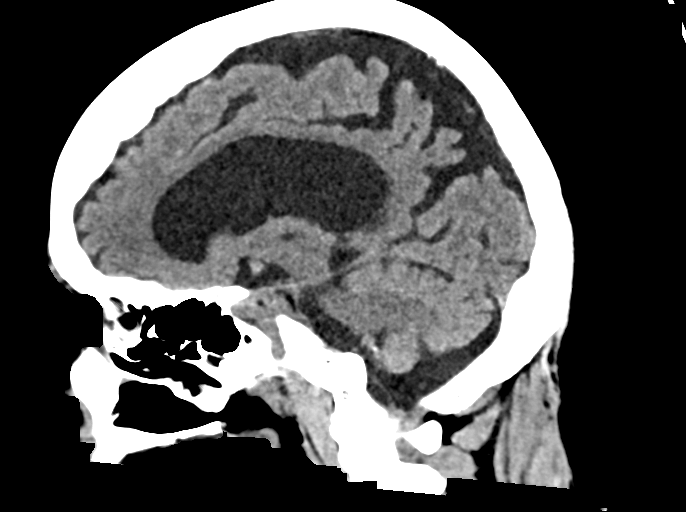

[16 of 47 positions shown; findings below may reference images not displayed]

FINDINGS: Brain: No evidence of acute territorial infarction, hemorrhage,
hydrocephalus,extra-axial collection or mass lesion/mass effect.
There is dilatation the ventricles and sulci consistent with
age-related atrophy. Low-attenuation changes in the deep white
matter consistent with small vessel ischemia. Again noted is an
incomplete septum pellucidum.

Vascular: No hyperdense vessel or unexpected calcification.

Skull: The skull is intact. No fracture or focal lesion identified.

Sinuses/Orbits: The visualized paranasal sinuses and mastoid air
cells are clear. The orbits and globes intact.

Other: None
IMPRESSION: No acute intracranial abnormality.

Findings consistent with age related atrophy and chronic small
vessel ischemia
